# Patient Record
Sex: Male | Born: 1995 | Race: White | Hispanic: No | Marital: Single | Smoking: Never smoker
Health system: Southern US, Community
[De-identification: ages and names within clinical notes are randomized; demographics above are authoritative.]

## PROBLEM LIST (undated history)

## (undated) DIAGNOSIS — D367 Benign neoplasm of other specified sites: Secondary | ICD-10-CM

## (undated) HISTORY — PX: NO PAST SURGERIES: SHX2092

---
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
## 2017-04-26 ENCOUNTER — Encounter (HOSPITAL_COMMUNITY): Payer: Self-pay | Admitting: Emergency Medicine

## 2017-04-26 ENCOUNTER — Emergency Department (HOSPITAL_COMMUNITY)
Admission: EM | Admit: 2017-04-26 | Discharge: 2017-04-26 | Disposition: A | Discharge status: Home / Self Care | Payer: Medicaid Other | Attending: Emergency Medicine | Admitting: Emergency Medicine

## 2017-04-26 DIAGNOSIS — L02419 Cutaneous abscess of limb, unspecified: Secondary | ICD-10-CM

## 2017-04-26 DIAGNOSIS — L02414 Cutaneous abscess of left upper limb: Secondary | ICD-10-CM | POA: Insufficient documentation

## 2017-04-26 DIAGNOSIS — R2232 Localized swelling, mass and lump, left upper limb: Secondary | ICD-10-CM | POA: Diagnosis present

## 2017-04-26 MED ORDER — LIDOCAINE HCL (PF) 1 % IJ SOLN
5.0000 mL | Freq: Once | INTRAMUSCULAR | Status: AC
  Administered 2017-04-26: 5 mL
  Filled 2017-04-26: qty 5

## 2017-04-26 MED ORDER — CEPHALEXIN 500 MG PO CAPS: 500.0000 mg | ORAL_CAPSULE | Freq: Two times a day (BID) | ORAL | 0 refills | Status: AC

## 2017-04-26 NOTE — ED Triage Notes (Signed)
Pt has abscess to left forearm, states he is unsure if it was a pimple that got infected or a spider bite. Denies fevers

## 2017-04-26 NOTE — ED Provider Notes (Signed)
[facility] DEPT Provider Note   CSN: [id number] Arrival date & time: 04/26/17  1018  By signing my name below, I, Dora Sims, attest that this documentation has been prepared under the direction and in the presence of Shary Decamp, PA-C. Electronically Signed: Dora Sims, Scribe. 04/26/2017. 10:57 AM.  History   Chief Complaint Chief Complaint  Patient presents with  . Abscess   The history is provided by the patient. No language interpreter was used.    HPI Comments: Gerald Hamilton is a 21 y.o. male who presents to the Emergency Department for evaluation of a moderate, gradually worsening area of pain, swelling, and redness to the dorsal left forearm for one week. No known insect bites to the area. He has been cleaning the wound with soap and water and using peroxide without improvement. No medications tried. He states the pain intermittently began radiating distally last night and he developed some nausea this morning. Tetanus status is UTD. He denies fevers, chills, vomiting, or any other associated symptoms.  History reviewed. No pertinent past medical history.  There are no active problems to display for this patient.   History reviewed. No pertinent surgical history.   Home Medications    Prior to Admission medications   Medication Sig Start Date End Date Taking? Authorizing Provider  ibuprofen (ADVIL,MOTRIN) 600 MG tablet Take 1 tablet (600 mg total) by mouth every 6 (six) hours as needed for mild pain. 02/03/15   Isaac Bliss, MD  permethrin (ELIMITE) 5 % cream Apply from neck down.  Leave in place 8-12 hours, then wash off.  Repeat in 1 week, if needed. 05/04/15   Melony Overly, MD    Family History No family history on file.  Social History Social History  Substance Use Topics  . Smoking status: Never Smoker  . Smokeless tobacco: Not on file  . Alcohol use No     Allergies   Patient has no known allergies.   Review of Systems Review of Systems    Constitutional: Negative for chills and fever.  Gastrointestinal: Positive for nausea. Negative for vomiting.  Skin: Positive for color change and wound.   Physical Exam Updated Vital Signs BP 125/88   Pulse 92   Temp 97.9 F (36.6 C) (Oral)   Resp 15   SpO2 100%   Physical Exam  Constitutional: He is oriented to person, place, and time. He appears well-developed and well-nourished. No distress.  HENT:  Head: Normocephalic and atraumatic.  Eyes: Conjunctivae and EOM are normal.  Neck: Neck supple. No tracheal deviation present.  Cardiovascular: Normal rate.   Pulmonary/Chest: Effort normal. No respiratory distress.  Musculoskeletal: Normal range of motion.  Neurological: He is alert and oriented to person, place, and time.  Skin: Skin is warm and dry.  2 cm fluctuant abscess noted on left dorsal forearm. Mild erythema. TTP.  Psychiatric: He has a normal mood and affect. His behavior is normal.  Nursing note and vitals reviewed.  ED Treatments / Results  Labs (all labs ordered are listed, but only abnormal results are displayed) Labs Reviewed - No data to display  EKG  EKG Interpretation None       Radiology No results found.  Procedures Procedures (including critical care time)  DIAGNOSTIC STUDIES: Oxygen Saturation is 100% on RA, normal by my interpretation.    COORDINATION OF CARE: 10:51 AM Discussed treatment plan with pt at bedside and pt agreed to plan.  Medications Ordered in ED Medications  lidocaine (PF) (XYLOCAINE) 1 %  injection 5 mL (not administered)     Initial Impression / Assessment and Plan / ED Course  I have reviewed the triage vital signs and the nursing notes.  Pertinent labs & imaging results that were available during my care of the patient were reviewed by me and considered in my medical decision making (see chart for details).    {I have reviewed the relevant previous healthcare records.  {I obtained HPI from historian.   ED  Course:  Assessment: Gerald Hamilton is a 21 y.o. male who presents to ED for abscess requiring incision and drainage. I&D performed by rotating family medicine provider (see note). Patient tolerated the procedure well.  There is mild erythema. Will Rx keflex. Wound care instructions discussed. Wound check in 2-3 days. Return to ER if concern for spread of infection, increasing pain, fevers or other concerns. All questions answered.  Disposition/Plan:  DC Home Additional Verbal discharge instructions given and discussed with patient.  Pt Instructed to f/u with PCP in the next week for evaluation and treatment of symptoms. Return precautions given Pt acknowledges and agrees with plan  Supervising Physician Drenda Freeze, MD  Final Clinical Impressions(s) / ED Diagnoses   Final diagnoses:  Abscess of forearm    New Prescriptions New Prescriptions   No medications on file   I personally performed the services described in this documentation, which was scribed in my presence. The recorded information has been reviewed and is accurate.    Shary Decamp, PA-C 04/26/17 1102    Drenda Freeze, MD 04/27/17 [phone]

## 2017-04-26 NOTE — ED Provider Notes (Signed)
INCISION AND DRAINAGE Performed by: Lockie Pares Consent: Verbal consent obtained. Risks and benefits: risks, benefits and alternatives were discussed Type: abscess  Body area: left forearm   Anesthesia: local infiltration  Incision was made with a scalpel.  Local anesthetic: lidocaine 1%    Anesthetic total: 6 ml  Complexity: simple  No significant loculations  Drainage: purulent  Drainage amount: 3 ml   Patient tolerance: Patient tolerated the procedure well with no immediate complications.     Tonette Bihari, MD 04/26/17 1134    Drenda Freeze, MD 04/26/17 [phone]

## 2017-04-26 NOTE — Discharge Instructions (Signed)
Please read and follow all provided instructions.  Your diagnoses today include:  1. Abscess of forearm    Tests performed today include: Vital signs. See below for your results today.   Medications prescribed:   Take any prescribed medications only as directed.   Home care instructions:  Follow any educational materials contained in this packet  Follow-up instructions: Return to the Emergency Department in 48 hours for a recheck if your symptoms are not significantly improved.  Please follow-up with your primary care provider in the next 1 week for further evaluation of your symptoms.   Return instructions:  Return to the Emergency Department if you have: Fever Worsening symptoms Worsening pain Worsening swelling Redness of the skin that moves away from the affected area, especially if it streaks away from the affected area  Any other emergent concerns  Additional Information: If you have recurrent abscesses, try both the following. Use a Qtip to apply an over-the-counter antibiotic to the inside of your nostrils, twice a day for 5 days. Wash your body with over-the-counter Hibaclens once a day for one week and then once every two weeks. This can reduce the amount of bacterial on your skin that causes boils and lead to fewer boils. If you continue to have multiple or recurrent boils, you should see a dermatologist (skin doctor).   Your vital signs today were: BP 125/88    Pulse 92    Temp 97.9 F (36.6 C) (Oral)    Resp 15    SpO2 100%  If your blood pressure (BP) was elevated above 135/85 this visit, please have this repeated by your doctor within one month. --------------

## 2017-04-26 NOTE — ED Notes (Signed)
Pt has abscess on left forearm x 1 week. States is mildly painful and has been draining at times. States others in is household have had similar lesions.

## 2018-01-03 ENCOUNTER — Ambulatory Visit (HOSPITAL_COMMUNITY)
Admission: EM | Admit: 2018-01-03 | Discharge: 2018-01-03 | Disposition: A | Discharge status: Home / Self Care | Payer: Medicaid Other | Attending: Family Medicine | Admitting: Family Medicine

## 2018-01-03 ENCOUNTER — Other Ambulatory Visit: Payer: Self-pay

## 2018-01-03 DIAGNOSIS — R002 Palpitations: Secondary | ICD-10-CM

## 2018-01-03 NOTE — ED Triage Notes (Signed)
States while at work today had heart palpitations and vision became blurry. States had not eaten earlier and once ate vision improved. Denies palpitations at this time.

## 2018-01-03 NOTE — ED Provider Notes (Signed)
  [place]    CSN: [id number] Arrival date & time: 01/03/18  1837   Chief Complaint  Patient presents with  . Palpitations    Gerald Hamilton is a 22 y.o. male here for palpitations.  Length of issue: 1 day How long does it last: a few seconds Light headedness/passing out? Yes, felt like he was going to pass out Chest pain/shortness of breath? No Hx of arrhythmia? No Medication changes/illicit substances? No  ROS: Cardiac: As noted in HPI Pulm: No SOB  No known medical hx.   No prior surgeries.  Takes no meds routinely.  NKDA    BP 125/78 (BP Location: Right Arm)   Pulse 85   Temp 98.2 F (36.8 C) (Oral)   SpO2 100%  Gen: Awake, alert, appearing stated age Eyes: PERRLA Mouth: MMM Heart: RRR, no bruits, no LE edema Lungs: CTAB, no accessory muscle use Neuro: No cerebellar signs MSK: No muscle group atrophy or asymmetry Psych: Age appropriate judgment and insight, mood/affect WNL  Palpitations  EKG shows sinus arrhythmia. Reassurance for now.  Follow up with reg pcp if continuing. Not likely to be related structurally. The patient voiced understanding and agreement to the plan.      Shelda Pal, [place] 01/03/18 1931

## 2018-12-16 ENCOUNTER — Encounter (HOSPITAL_COMMUNITY): Payer: Self-pay

## 2018-12-16 ENCOUNTER — Ambulatory Visit (HOSPITAL_COMMUNITY)
Admission: EM | Admit: 2018-12-16 | Discharge: 2018-12-16 | Disposition: A | Discharge status: Home / Self Care | Payer: Medicaid Other | Attending: Emergency Medicine | Admitting: Emergency Medicine

## 2018-12-16 DIAGNOSIS — J40 Bronchitis, not specified as acute or chronic: Secondary | ICD-10-CM | POA: Diagnosis not present

## 2018-12-16 MED ORDER — FLUTICASONE PROPIONATE 50 MCG/ACT NA SUSP: 2.0000 | Freq: Every day | NASAL | 0 refills | Status: DC

## 2018-12-16 MED ORDER — PREDNISONE 20 MG PO TABS: 40.0000 mg | ORAL_TABLET | Freq: Every day | ORAL | 0 refills | Status: AC

## 2018-12-16 MED ORDER — ALBUTEROL SULFATE HFA 108 (90 BASE) MCG/ACT IN AERS: 1.0000 | INHALATION_SPRAY | Freq: Four times a day (QID) | RESPIRATORY_TRACT | 0 refills | Status: DC | PRN

## 2018-12-16 MED ORDER — AEROCHAMBER PLUS MISC: 2 refills | Status: DC

## 2018-12-16 NOTE — ED Provider Notes (Signed)
HPI  SUBJECTIVE:  Gerald Hamilton is a 23 y.o. male who presents with nonproductive cough, states that he has felt feverish, but has never checked his temperature, chest congestion, nasal congestion, rhinorrhea, postnasal drip, headaches for the past 5 days.  Reports wheezing after coughing only.  He states that the chest congestion is starting to get better.  Denies sinus pain or pressure, chest pain, shortness of breath, dyspnea on exertion.  He is able to sleep at night without waking up coughing.  No allergy or GERD symptoms.  No antibiotics in the past 3 months.  No antipyretic in the past 4 to 6 hours.  He tried NyQuil with improvement in his symptoms.  No aggravating factors.  Past medical history negative for asthma, smoking tobacco, vaping.  He smokes marijuana occasionally, last use 1 week ago.  No history of allergies, diabetes, hypertension.  PMD: None.  History reviewed. No pertinent past medical history.  History reviewed. No pertinent surgical history.  No family history on file.  Social History   Tobacco Use  . Smoking status: Never Smoker  . Smokeless tobacco: Never Used  Substance Use Topics  . Alcohol use: No  . Drug use: No    No current facility-administered medications for this encounter.   Current Outpatient Medications:  .  albuterol (PROVENTIL HFA;VENTOLIN HFA) 108 (90 Base) MCG/ACT inhaler, Inhale 1-2 puffs into the lungs every 6 (six) hours as needed for wheezing or shortness of breath., Disp: 1 Inhaler, Rfl: 0 .  fluticasone (FLONASE) 50 MCG/ACT nasal spray, Place 2 sprays into both nostrils daily., Disp: 16 g, Rfl: 0 .  predniSONE (DELTASONE) 20 MG tablet, Take 2 tablets (40 mg total) by mouth daily with breakfast for 5 days., Disp: 10 tablet, Rfl: 0 .  Spacer/Aero-Holding Chambers (AEROCHAMBER PLUS) inhaler, Use as instructed, Disp: 1 each, Rfl: 2  No Known Allergies   ROS  As noted in HPI.   Physical Exam  BP 126/75 (BP Location: Right Arm)   Pulse 91    Temp 98.1 F (36.7 C) (Oral)   Resp 18   SpO2 100%   Constitutional: Well developed, well nourished, no acute distress Eyes:  EOMI, conjunctiva normal bilaterally HENT: Normocephalic, atraumatic,mucus membranes moist.  Positive clear nasal congestion.  No sinus tenderness.  Positive erythematous, swollen turbinates.  Positive cobblestoning.  No postnasal drip. Respiratory: Normal inspiratory effort, rhonchi that clear with coughing.  Wheezing at bases bilaterally.  Fair air movement.  No chest wall tenderness Cardiovascular: Normal rate, regular rhythm, no murmurs rubs or gallops GI: nondistended skin: No rash, skin intact Musculoskeletal: no deformities Neurologic: Alert & oriented x 3, no focal neuro deficits Psychiatric: Speech and behavior appropriate   ED Course   Medications - No data to display  No orders of the defined types were placed in this encounter.   No results found for this or any previous visit (from the past 24 hour(s)). No results found.  ED Clinical Impression  Bronchitis   ED Assessment/Plan  presentation consistent with bronchitis.  Home with scheduled albuterol with a spacer, Flonase, Mucinex D, Tessalon, 40 mg of prednisone for 5 days.  Return here in 5 or 6 days if not better, to the ER if he gets worse.  Providing primary care list for ongoing care..  Discussed MDM, treatment plan, and plan for follow-up with patient. Discussed sn/sx that should prompt return to the ED. patient  agrees with plan.   Meds ordered this encounter  Medications  . albuterol (  PROVENTIL HFA;VENTOLIN HFA) 108 (90 Base) MCG/ACT inhaler    Sig: Inhale 1-2 puffs into the lungs every 6 (six) hours as needed for wheezing or shortness of breath.    Dispense:  1 Inhaler    Refill:  0  . fluticasone (FLONASE) 50 MCG/ACT nasal spray    Sig: Place 2 sprays into both nostrils daily.    Dispense:  16 g    Refill:  0  . predniSONE (DELTASONE) 20 MG tablet    Sig: Take 2  tablets (40 mg total) by mouth daily with breakfast for 5 days.    Dispense:  10 tablet    Refill:  0  . Spacer/Aero-Holding Chambers (AEROCHAMBER PLUS) inhaler    Sig: Use as instructed    Dispense:  1 each    Refill:  2    *This clinic note was created using Dragon dictation software. Therefore, there may be occasional mistakes despite careful proofreading.   ?   Melynda Ripple, MD 12/16/18 2000

## 2018-12-16 NOTE — ED Triage Notes (Signed)
Pt c/o cough x1wk, had a sore throat but went away, yesterday had no energy but feeling better today. States needs a MD note for work

## 2018-12-16 NOTE — Discharge Instructions (Addendum)
2 puffs from your albuterol inhaler every 4 hours for the next 2 days, then every 6 hours for 2 days after that.  Then you may use your albuterol as needed.  Use a spacer when you use your albuterol inhaler.  Try some Mucinex D, Flonase, finish the prednisone even if you feel better.  Tessalon will help with the cough.  Follow-up with a primary care physician for ongoing care, see list below.  Below is a list of primary care practices who are taking new patients for you to follow-up with.  [facility] at [facility] [address] [place], [place] [address] [phone]  [place], [place] [address] [phone]  [facility] [phone] [place] [address]  [facility]: [place]  [phone]  [facility]: [place]   [phone]  [facility]: [address] [place]  [phone]  [facility] : [address] [phone]  [facility]: [address] [place] [address] [phone]  [facility]: [place] [phone]  Dr. Blanchie Serve [place]  [phone]  Dr. Benito Mccreedy, [facility] [place], [place] [address]  [phone]  Go to www.goodrx.com to look up your medications. This will give you a list of where you can find your prescriptions at the most affordable prices. Or ask the pharmacist what the cash price is, or if they have any other discount programs available to help make your medication more  affordable. This can be less expensive than what you would pay with insurance.

## 2018-12-21 ENCOUNTER — Other Ambulatory Visit: Payer: Self-pay

## 2018-12-21 ENCOUNTER — Encounter (HOSPITAL_COMMUNITY): Payer: Self-pay | Admitting: Emergency Medicine

## 2018-12-21 ENCOUNTER — Ambulatory Visit (HOSPITAL_COMMUNITY)
Admission: EM | Admit: 2018-12-21 | Discharge: 2018-12-21 | Disposition: A | Discharge status: Home / Self Care | Payer: Medicaid Other | Attending: Family Medicine | Admitting: Family Medicine

## 2018-12-21 DIAGNOSIS — R059 Cough, unspecified: Secondary | ICD-10-CM

## 2018-12-21 DIAGNOSIS — R05 Cough: Secondary | ICD-10-CM | POA: Diagnosis not present

## 2018-12-21 NOTE — ED Provider Notes (Signed)
[place]    CSN: [id number] Arrival date & time: 12/21/18  1048     History   Chief Complaint Chief Complaint  Patient presents with  . Cough    HPI Gerald Hamilton is a 23 y.o. male.   Pt is a 23 year old male here for reevaluation and return to work.  He was seen here 5 days ago and treated for bronchitis.  Reports that he has been taking all the medications that were prescribed.  He is feeling better.  He still has mild lingering cough.  He is hoping to go back to work.     History reviewed. No pertinent past medical history.  There are no active problems to display for this patient.   History reviewed. No pertinent surgical history.     Home Medications    Prior to Admission medications   Medication Sig Start Date End Date Taking? Authorizing Provider  albuterol (PROVENTIL HFA;VENTOLIN HFA) 108 (90 Base) MCG/ACT inhaler Inhale 1-2 puffs into the lungs every 6 (six) hours as needed for wheezing or shortness of breath. 12/16/18  Yes Melynda Ripple, MD  fluticasone (FLONASE) 50 MCG/ACT nasal spray Place 2 sprays into both nostrils daily. 12/16/18  Yes Melynda Ripple, MD  predniSONE (DELTASONE) 20 MG tablet Take 2 tablets (40 mg total) by mouth daily with breakfast for 5 days. 12/16/18 12/21/18 Yes Melynda Ripple, MD  Spacer/Aero-Holding Chambers (AEROCHAMBER PLUS) inhaler Use as instructed 12/16/18   Melynda Ripple, MD    Family History Family History  Problem Relation Age of Onset  . Healthy Mother   . Healthy Father     Social History Social History   Tobacco Use  . Smoking status: Never Smoker  . Smokeless tobacco: Never Used  Substance Use Topics  . Alcohol use: No  . Drug use: No     Allergies   Patient has no known allergies.   Review of Systems Review of Systems  Constitutional: Negative for fatigue and fever.  Respiratory: Positive for cough. Negative for apnea, choking, chest tightness, shortness of breath, wheezing and  stridor.      Physical Exam Triage Vital Signs ED Triage Vitals [12/21/18 1116]  Enc Vitals Group     BP 117/70     Pulse Rate 66     Resp 18     Temp 98.2 F (36.8 C)     Temp Source Oral     SpO2 100 %     Weight      Height      Head Circumference      Peak Flow      Pain Score 0     Pain Loc      Pain Edu?      Excl. in [place]?    No data found.  Updated Vital Signs BP 117/70 (BP Location: Left Arm)   Pulse 66   Temp 98.2 F (36.8 C) (Oral)   Resp 18   SpO2 100%   Visual Acuity Right Eye Distance:   Left Eye Distance:   Bilateral Distance:    Right Eye Near:   Left Eye Near:    Bilateral Near:     Physical Exam Vitals signs and nursing note reviewed.  Constitutional:      General: He is not in acute distress.    Appearance: He is well-developed. He is not ill-appearing, toxic-appearing or diaphoretic.  HENT:     Head: Normocephalic and atraumatic.     Right Ear: Tympanic  membrane and ear canal normal.     Left Ear: Tympanic membrane and ear canal normal.     Nose: Nose normal.     Mouth/Throat:     Pharynx: Oropharynx is clear.  Eyes:     Conjunctiva/sclera: Conjunctivae normal.  Neck:     Musculoskeletal: Normal range of motion.  Cardiovascular:     Rate and Rhythm: Normal rate and regular rhythm.     Heart sounds: No murmur.  Pulmonary:     Effort: Pulmonary effort is normal. No respiratory distress.     Breath sounds: Normal breath sounds.  Musculoskeletal: Normal range of motion.  Skin:    General: Skin is warm and dry.  Neurological:     Mental Status: He is alert.  Psychiatric:        Mood and Affect: Mood normal.      UC Treatments / Results  Labs (all labs ordered are listed, but only abnormal results are displayed) Labs Reviewed - No data to display  EKG None  Radiology No results found.  Procedures Procedures (including critical care time)  Medications Ordered in UC Medications - No data to display  Initial  Impression / Assessment and Plan / UC Course  I have reviewed the triage vital signs and the nursing notes.  Pertinent labs & imaging results that were available during my care of the patient were reviewed by me and considered in my medical decision making (see chart for details).    Cough and return to work   Patient appears well. Lungs clear Vital signs stable, nontoxic or ill-appearing. I feel it is safe for him to return to work. He can continue the medications that were prescribed Follow up as needed for continued or worsening symptoms  Final Clinical Impressions(s) / UC Diagnoses   Final diagnoses:  Cough     Discharge Instructions     Seems as if your  symptoms have gotten better. I believe it is safe you to go back to work Follow up as needed for continued or worsening symptoms     ED Prescriptions    None     Controlled Substance Prescriptions [place] Controlled Substance Registry consulted? Not Applicable   Orvan July, NP 12/21/18 1214

## 2018-12-21 NOTE — ED Triage Notes (Signed)
Pt presents to [facility] for assessment of 2 days of cough with body aches on day 1.  States now just a very mild cough, but boss would not let him return to work without being seen.

## 2018-12-21 NOTE — Discharge Instructions (Signed)
Seems as if your  symptoms have gotten better. I believe it is safe you to go back to work Follow up as needed for continued or worsening symptoms

## 2019-05-11 ENCOUNTER — Other Ambulatory Visit: Payer: Self-pay

## 2019-05-11 ENCOUNTER — Ambulatory Visit (HOSPITAL_COMMUNITY)
Admission: EM | Admit: 2019-05-11 | Discharge: 2019-05-11 | Disposition: A | Discharge status: Home / Self Care | Payer: Medicaid Other | Attending: Family Medicine | Admitting: Family Medicine

## 2019-05-11 ENCOUNTER — Encounter (HOSPITAL_COMMUNITY): Payer: Self-pay | Admitting: *Deleted

## 2019-05-11 DIAGNOSIS — B86 Scabies: Secondary | ICD-10-CM

## 2019-05-11 DIAGNOSIS — T7840XA Allergy, unspecified, initial encounter: Secondary | ICD-10-CM

## 2019-05-11 MED ORDER — METHYLPREDNISOLONE SODIUM SUCC 125 MG IJ SOLR
INTRAMUSCULAR | Status: AC
  Filled 2019-05-11: qty 2

## 2019-05-11 MED ORDER — METHYLPREDNISOLONE SODIUM SUCC 125 MG IJ SOLR
80.0000 mg | Freq: Once | INTRAMUSCULAR | Status: AC
  Administered 2019-05-11: 80 mg via INTRAMUSCULAR

## 2019-05-11 MED ORDER — PERMETHRIN 5 % EX CREA: TOPICAL_CREAM | CUTANEOUS | 1 refills | Status: DC

## 2019-05-11 NOTE — Discharge Instructions (Addendum)
Use the medication as prescribed.  Apply from head to toe and leave on for at least 8 hours and then wash off next morning. You may repeat this in 1 week if needed Steroid injection given here for the allergic reaction You can do Benadryl as needed for itching Follow up as needed for continued or worsening symptoms

## 2019-05-11 NOTE — ED Provider Notes (Signed)
[place]    CSN: [id number] Arrival date & time: 05/11/19  1325     History   Chief Complaint Chief Complaint  Patient presents with  . Rash    HPI Gerald Hamilton is a 23 y.o. male.   Patient is a 23 year old male who presents today with rash.  The rash is been present worsening over the past week.  Rash started on the stomach and back area and now is radiating to inner thighs.  Describes rash is very pruritic.  Notes that similar to rash previously when diagnosed with scabies.   Denies any fever, joint pain. Denies any recent changes in lotions, detergents, foods or other possible irritants. No recent travel. Nobody else at home has the rash. Patient has been outside but denies any contact with plants or insects. No new foods or medications.   ROS per HPI        History reviewed. No pertinent past medical history.  There are no active problems to display for this patient.   History reviewed. No pertinent surgical history.     Home Medications    Prior to Admission medications   Medication Sig Start Date End Date Taking? Authorizing Provider  albuterol (PROVENTIL HFA;VENTOLIN HFA) 108 (90 Base) MCG/ACT inhaler Inhale 1-2 puffs into the lungs every 6 (six) hours as needed for wheezing or shortness of breath. 12/16/18   Melynda Ripple, MD  fluticasone (FLONASE) 50 MCG/ACT nasal spray Place 2 sprays into both nostrils daily. 12/16/18   Melynda Ripple, MD  permethrin Nancy Fetter) 5 % cream Apply to affected area once 05/11/19   Orvan July, NP  Spacer/Aero-Holding Chambers (AEROCHAMBER PLUS) inhaler Use as instructed 12/16/18   Melynda Ripple, MD    Family History Family History  Problem Relation Age of Onset  . Healthy Mother   . Healthy Father     Social History Social History   Tobacco Use  . Smoking status: Never Smoker  . Smokeless tobacco: Never Used  Substance Use Topics  . Alcohol use: No  . Drug use: No     Allergies   Patient  has no known allergies.   Review of Systems Review of Systems   Physical Exam Triage Vital Signs ED Triage Vitals  Enc Vitals Group     BP 05/11/19 1348 139/87     Pulse Rate 05/11/19 1348 78     Resp 05/11/19 1348 16     Temp 05/11/19 1348 98.1 F (36.7 C)     Temp Source 05/11/19 1348 Oral     SpO2 05/11/19 1348 98 %     Weight --      Height --      Head Circumference --      Peak Flow --      Pain Score 05/11/19 1346 0     Pain Loc --      Pain Edu? --      Excl. in [place]? --    No data found.  Updated Vital Signs BP 139/87 (BP Location: Left Arm)   Pulse 78   Temp 98.1 F (36.7 C) (Oral)   Resp 16   SpO2 98%   Visual Acuity Right Eye Distance:   Left Eye Distance:   Bilateral Distance:    Right Eye Near:   Left Eye Near:    Bilateral Near:     Physical Exam Vitals signs and nursing note reviewed.  Constitutional:      Appearance: Normal appearance.  HENT:  Head: Normocephalic and atraumatic.     Nose: Nose normal.     Mouth/Throat:     Pharynx: Oropharynx is clear.  Eyes:     Conjunctiva/sclera: Conjunctivae normal.  Neck:     Musculoskeletal: Normal range of motion.  Pulmonary:     Effort: Pulmonary effort is normal.  Musculoskeletal: Normal range of motion.  Skin:    General: Skin is warm and dry.     Findings: Erythema and rash present.     Comments: Widespread erythematous papules noted to entire back, stomach and upper leg areas   Neurological:     Mental Status: He is alert.  Psychiatric:        Mood and Affect: Mood normal.      UC Treatments / Results  Labs (all labs ordered are listed, but only abnormal results are displayed) Labs Reviewed - No data to display  EKG   Radiology No results found.  Procedures Procedures (including critical care time)  Medications Ordered in UC Medications  methylPREDNISolone sodium succinate (SOLU-MEDROL) 125 mg/2 mL injection 80 mg (has no administration in time range)     Initial Impression / Assessment and Plan / UC Course  I have reviewed the triage vital signs and the nursing notes.  Pertinent labs & imaging results that were available during my care of the patient were reviewed by me and considered in my medical decision making (see chart for details).     Rash consistent with scabies.  History of same.  Will treat with permethrin cream.  Repeat in 1 week as needed. Recommend he wash all sheets and clothing in hot water. Steroid injection given here in clinic for acute allergic reaction Recommended Benadryl as needed for itching Follow up as needed for continued or worsening symptoms  Final Clinical Impressions(s) / UC Diagnoses   Final diagnoses:  Scabies  Allergic reaction, initial encounter     Discharge Instructions     Use the medication as prescribed.  Apply from head to toe and leave on for at least 8 hours and then wash off next morning. You may repeat this in 1 week if needed Steroid injection given here for the allergic reaction You can do Benadryl as needed for itching Follow up as needed for continued or worsening symptoms     ED Prescriptions    Medication Sig Dispense Auth. Provider   permethrin (ELIMITE) 5 % cream Apply to affected area once 60 g Loura Halt A, NP     Controlled Substance Prescriptions [place] Controlled Substance Registry consulted? Not Applicable   Orvan July, NP 05/11/19 1430

## 2019-05-11 NOTE — ED Triage Notes (Signed)
Patient reports itching started about 1 week and rash started about 5 days, patient reports back and stomach and radiating to thighs.   Patient also needs work note.

## 2019-11-30 ENCOUNTER — Ambulatory Visit (HOSPITAL_COMMUNITY)
Admission: EM | Admit: 2019-11-30 | Discharge: 2019-11-30 | Disposition: A | Discharge status: Home / Self Care | Payer: Medicaid Other | Attending: Urgent Care | Admitting: Urgent Care

## 2019-11-30 ENCOUNTER — Encounter (HOSPITAL_COMMUNITY): Payer: Self-pay

## 2019-11-30 ENCOUNTER — Other Ambulatory Visit: Payer: Self-pay

## 2019-11-30 DIAGNOSIS — B078 Other viral warts: Secondary | ICD-10-CM | POA: Diagnosis not present

## 2019-11-30 MED ORDER — IMIQUIMOD 5 % EX CREA: TOPICAL_CREAM | Freq: Every day | CUTANEOUS | 2 refills | Status: DC

## 2019-11-30 NOTE — ED Triage Notes (Signed)
Pt states he has has a wart on his right hand for a while and he decided to get rid of it. Now it hurts and he wants it removed.

## 2019-11-30 NOTE — ED Provider Notes (Signed)
[place]   MRN: [id number] DOB: 01-Sep-1996  Subjective:   Gerald Hamilton is a 24 y.o. male presenting for consult on removal of a wart of his right hand. Has been using topical otc treatment with some success but ran out. Wants to know if it can be removed at our clinic.   No current facility-administered medications for this encounter.  Current Outpatient Medications:  .  albuterol (PROVENTIL HFA;VENTOLIN HFA) 108 (90 Base) MCG/ACT inhaler, Inhale 1-2 puffs into the lungs every 6 (six) hours as needed for wheezing or shortness of breath. (Patient not taking: Reported on 11/30/2019), Disp: 1 Inhaler, Rfl: 0 .  fluticasone (FLONASE) 50 MCG/ACT nasal spray, Place 2 sprays into both nostrils daily. (Patient not taking: Reported on 11/30/2019), Disp: 16 g, Rfl: 0 .  permethrin (ELIMITE) 5 % cream, Apply to affected area once (Patient not taking: Reported on 11/30/2019), Disp: 60 g, Rfl: 1 .  Spacer/Aero-Holding Chambers (AEROCHAMBER PLUS) inhaler, Use as instructed (Patient not taking: Reported on 11/30/2019), Disp: 1 each, Rfl: 2   No Known Allergies  History reviewed. No pertinent past medical history.   History reviewed. No pertinent surgical history.  Family History  Problem Relation Age of Onset  . Healthy Mother   . Healthy Father     Social History   Tobacco Use  . Smoking status: Never Smoker  . Smokeless tobacco: Never Used  Substance Use Topics  . Alcohol use: No  . Drug use: No    ROS   Objective:   Vitals: BP 122/74 (BP Location: Right Arm)   Pulse 74   Temp 98.4 F (36.9 C) (Oral)   Resp 16   Wt 125 lb (56.7 kg)   SpO2 100%   Physical Exam Constitutional:      General: He is not in acute distress.    Appearance: Normal appearance. He is well-developed and normal weight. He is not ill-appearing, toxic-appearing or diaphoretic.  HENT:     Head: Normocephalic and atraumatic.     Right Ear: External ear normal.     Left Ear: External ear normal.       Nose: Nose normal.     Mouth/Throat:     Pharynx: Oropharynx is clear.  Eyes:     General: No scleral icterus.       Right eye: No discharge.        Left eye: No discharge.     Extraocular Movements: Extraocular movements intact.     Pupils: Pupils are equal, round, and reactive to light.  Cardiovascular:     Rate and Rhythm: Normal rate.  Pulmonary:     Effort: Pulmonary effort is normal.  Musculoskeletal:       Arms:     Cervical back: Normal range of motion.  Neurological:     Mental Status: He is alert and oriented to person, place, and time.  Psychiatric:        Mood and Affect: Mood normal.        Behavior: Behavior normal.        Thought Content: Thought content normal.        Judgment: Judgment normal.      Assessment and Plan :   1. Common wart     This is high risk area for procedural removal given extensor ligaments. Imiquimod is covered by Medicaid and therefore will have patient use this. Referral to dermatology is pending. Counseled patient on potential for adverse effects with medications prescribed/recommended today, ER and  return-to-clinic precautions discussed, patient verbalized understanding.    Jaynee Eagles, [place] 11/30/19 1953

## 2020-10-14 ENCOUNTER — Other Ambulatory Visit (HOSPITAL_COMMUNITY)
Admission: RE | Admit: 2020-10-14 | Discharge: 2020-10-14 | Disposition: A | Discharge status: Home / Self Care | Payer: Medicaid Other | Source: Ambulatory Visit | Attending: Family Medicine | Admitting: Family Medicine

## 2020-10-14 ENCOUNTER — Other Ambulatory Visit: Payer: Self-pay

## 2020-10-14 ENCOUNTER — Ambulatory Visit (INDEPENDENT_AMBULATORY_CARE_PROVIDER_SITE_OTHER): Payer: Medicaid Other | Admitting: Family Medicine

## 2020-10-14 VITALS — BP 120/60 | HR 95 | Ht 63.0 in | Wt 123.5 lb

## 2020-10-14 DIAGNOSIS — Z23 Encounter for immunization: Secondary | ICD-10-CM

## 2020-10-14 DIAGNOSIS — Z7689 Persons encountering health services in other specified circumstances: Secondary | ICD-10-CM | POA: Insufficient documentation

## 2020-10-14 DIAGNOSIS — R221 Localized swelling, mass and lump, neck: Secondary | ICD-10-CM | POA: Diagnosis present

## 2020-10-14 NOTE — Patient Instructions (Signed)
It was nice to meet you today,  I will call you with the lab tests when I get them.  This usually takes 2 days.  For the lump on your neck I need you to the ear nose and throat doctor for evaluation of possible thyroglossal duct or bronchial cleft cyst.  Give them a week to call you back.  If you have not heard from them in a week please call our office and let us know.  I would talk to your parents about finding out if you are on their insurance.  Typically children can remain on her parents insurance until age of 51.  If not you can also look into the governments insurance marketplace at healthcare.gov for reduced cost insurance.  Have a great day,  Clemetine Marker, MD

## 2020-10-14 NOTE — Assessment & Plan Note (Signed)
Nontender, firm mobile mass on the neck behind the right ear that has been present for at least three years.  Differential includes sebaceous cyst, enlarged lymph node, or branchial cleft or thyroglossal duct cyst.  Will refer to ENT to evaluate for branchial cleft cyst.

## 2020-10-14 NOTE — Progress Notes (Signed)
   Covid-19 Vaccination Clinic  Name:  Gerald Hamilton    MRN: [id number] DOB: [birth year]  10/14/2020   First COVID vaccine given in RD, site unremarkable, tolerated injection well.   Gerald Hamilton was observed post Covid-19 immunization for 15 minutes without incident. He was provided with Vaccine Information Sheet and instruction to access the V-Safe system.   Gerald Hamilton was instructed to call 911 with any severe reactions post vaccine: Marland Kitchen Difficulty breathing  . Swelling of face and throat  . A fast heartbeat  . A bad rash all over body  . Dizziness and weakness    Provided patient with updated immunization record and card. Scheduled patient for nurse visit on 1/31 for second COVID vaccine.   Talbot Grumbling, RN

## 2020-10-14 NOTE — Progress Notes (Signed)
    SUBJECTIVE:   CHIEF COMPLAINT / HPI:  Patient is here to establish care.  Has no concerns other than the on the right side of his neck  Right-sided neck mass.:  Patient states that 3 years ago he noticed a mass on the right side of his neck behind his ear.  It is nontender.  He states that over the past 3 months it has increased in size to the point where others have noticed it.Marland Kitchen  He states it is nontender.  Has never had any drainage.  It is flesh-colored.  Has never noticed any rash.  Works at the [employer] and goes to school at [employer] and math.  Currently getting his GED.   Lives with sister.  Not currently in a relationship.  Sexually active  With male partner.  Wants STD testing.  No tobacco use.  Uses alcohol on the weekends.  Drinks 2 glasses of wine on weekends.  No marijuana or recreational drug use.    PERTINENT  PMH / PSH: None  OBJECTIVE:   BP 120/60   Pulse 95   Ht 5\' 3"  (1.6 m)   Wt 123 lb 8 oz (56 kg)   SpO2 98%   BMI 21.88 kg/m   General: Alert and oriented.  No acute distress.  Young healthy appearing male. HEENT: Pea-sized mobile firm well-circumscribed mass on the neck approximately 2 inches posterior and inferior to the ear.  Nontender.  Flesh-colored.  No drainage or punctate lesion.  PERRLA.,  EOMI.  Moist oral mucosa.  No oral pharyngeal erythema. Neck: Other than the mass stated above no cervical lymphadenopathy.  No thyromegaly.  Rhythm Pulmonary: Lungs clear auscultation bilaterally Abdomen: Soft, nontender to palpation normal bowel sounds. MSK: 5/5 strength upper and lower extremities bilaterally. Psych: Quiet, but otherwise pleasant affect with spontaneous speech.  ASSESSMENT/PLAN:   Neck mass Nontender, firm mobile mass on the neck behind the right ear that has been present for at least three years.  Differential includes sebaceous cyst, enlarged lymph node, or branchial cleft or thyroglossal duct cyst.  Will refer  to ENT to evaluate for branchial cleft cyst.   Encounter for assessment of STD exposure Sexually active with male partners as recently as last week. Desires STI testing.   - HIV, hep C, RPR - urine GC/CT     Benay Pike, MD [place]

## 2020-10-14 NOTE — Assessment & Plan Note (Signed)
Sexually active with male partners as recently as last week. Desires STI testing.   - HIV, hep C, RPR - urine GC/CT

## 2020-10-15 LAB — HEPATITIS C ANTIBODY: Hep C Virus Ab: <0.1 s/co ratio (ref 0.0–0.9)

## 2020-10-15 LAB — RPR: RPR Ser Ql: NONREACTIVE

## 2020-10-15 LAB — HIV ANTIBODY (ROUTINE TESTING W REFLEX): HIV Screen 4th Generation wRfx: NONREACTIVE

## 2020-10-17 LAB — URINE CYTOLOGY ANCILLARY ONLY
Chlamydia: NEGATIVE
Comment: NEGATIVE
Comment: NORMAL
Neisseria Gonorrhea: NEGATIVE

## 2020-11-07 ENCOUNTER — Ambulatory Visit: Payer: Medicaid Other

## 2020-11-14 ENCOUNTER — Ambulatory Visit: Payer: Medicaid Other

## 2021-04-19 ENCOUNTER — Other Ambulatory Visit: Payer: Self-pay

## 2021-04-19 ENCOUNTER — Ambulatory Visit (INDEPENDENT_AMBULATORY_CARE_PROVIDER_SITE_OTHER): Payer: Medicaid Other | Admitting: Family Medicine

## 2021-04-19 VITALS — BP 94/62 | HR 78 | Ht 63.0 in | Wt 127.4 lb

## 2021-04-19 DIAGNOSIS — R221 Localized swelling, mass and lump, neck: Secondary | ICD-10-CM

## 2021-04-19 DIAGNOSIS — B081 Molluscum contagiosum: Secondary | ICD-10-CM

## 2021-04-19 NOTE — Patient Instructions (Addendum)
Thank you for coming to see me today. It was a pleasure. Today we talked about:    Please follow-up with PCP in 1-2 weeks for freezing therapy of the lesions on your face  If you have any questions or concerns, please do not hesitate to call the office at [phone].  Best,   Carollee Leitz, MD    Molluscum Contagiosum, Adult Molluscum contagiosum is a skin infection that can cause a rash. The rash maygo away on its own, or it may need to be treated with a procedure or medicine. What are the causes? This condition is caused by a virus. The virus is contagious. This means that it can spread from person to person. It can spread through: Skin-to-skin contact with an infected person. Contact with an object that has the virus on it, such as a towel or clothing. Sexual activity. What increases the risk? You may be more likely to develop this condition if: You live in an area where the weather is moist and warm. You have a weak disease-fighting system (immune system). What are the signs or symptoms? The main symptom of this condition is a painless rash that appears 2-7 weeks after exposure to the virus. The rash is made up of small, dome-shaped bumps on the skin. The bumps may: Affect the genitals, thighs, face, neck, or abdomen. Be pink or flesh-colored. Appear one by one or in groups. Range from the size of a pinhead to the size of a pencil eraser. Feel firm, smooth, and waxy. Have a pit in the middle. Itch. For most people, the rash does not itch. How is this diagnosed? This condition may be diagnosed based on: Your symptoms and medical history. A physical exam. Scraping the bumps to collect a skin sample for testing. How is this treated? The rash usually goes away within 2 months, but it can sometimes take 6-12 months for it to clear completely. For some people, the rash may go away on its own without treatment. In some cases, treatment may be needed to keep the virus from  infecting other people or to keep the rash from spreading to other parts of your body. Treatment may also be done if you have anxiety or stress becauseof the way the rash looks. If needed, treatment may include: Surgery to remove the bumps by freezing them (cryosurgery). A procedure to scrape off the bumps (curettage). A procedure to remove the bumps with a laser. Putting medicine on the bumps (topical treatment). Follow these instructions at home: Take or apply over-the-counter and prescription medicines only as told by your health care provider. Do not scratch or pick at the bumps. Scratching or picking can cause the rash to spread to other parts of your body. How is this prevented? As long as you have bumps on your skin, the infection can spread to other people. To prevent this from happening: Do not share clothing or towels with others until the bumps go away. Do not use a public swimming pool, sauna, or shower until the bumps go away. Avoid close contact with others until the bumps go away. This includes sexual contact. Wash your hands often with soap and water. If soap and water are not available, use hand sanitizer. Cover the bumps with clothing or a bandage when you will be near other people. Contact a health care provider if: The bumps are spreading. The bumps are becoming red and sore. The bumps have not gone away after 12 months. Summary Molluscum contagiosum is  a skin infection that can cause a rash made up of small, dome-shaped bumps. The infection is caused by a virus. The rash usually goes away within 2 months, but it can sometimes take 6-12 months for it to clear completely. The rash often goes away on its own. However, treatment is sometimes recommended to keep the virus from infecting other people or to keep it from spreading to other parts of your body. This information is not intended to replace advice given to you by your health care provider. Make sure you discuss any  questions you have with your healthcare provider. Document Revised: 05/30/2020 Document Reviewed: 05/30/2020 Elsevier Patient Education  [place].

## 2021-04-19 NOTE — Progress Notes (Signed)
    SUBJECTIVE:   CHIEF COMPLAINT / HPI:   Patient unsure why he is here. Reports that he was called to come into make an appointment for ENT for the lump on his neck.  Was seen by Dr Jeannine Kitten back in January and referred to ENT.  Referral was not accepted due to insurance so needs new referral.  Reports no changes in neck mass since that time.    Facial lesion Reports break out of multiple facial lesions for about 5 months.  Largest lesion on right lower chin.  Has lesion on side of left chin and neck.  All lesions at different stages of growth.  Denies any fevers, pain, bleeding or discharge.  Denies any lesions anywhere else on body.    PERTINENT  PMH / PSH:  None  OBJECTIVE:   BP 94/62   Pulse 78   Ht 5\' 3"  (1.6 m)   Wt 127 lb 6.4 oz (57.8 kg)   SpO2 97%   BMI 22.57 kg/m    General: Alert, no acute distress Face: two small lesions with central indentation noted on chin area, similar lesion noted on left side of neck and one anterior lower neck.       Neck: firm non tender movable easily palpable mass noted on right side of neck  ASSESSMENT/PLAN:   Molluscum contagiosum Small circular lesions with central indentation consistent with Molluscum infection Discussed option to monitor as can self resolve in two three months or cryotherapy.  Opted for cryotherapy. -Scheduled appointment for cryotherapy 07/27   Neck mass No changes since previous visit.  ENT referral sent to out of network  -Will resend referral to ENT -Follow up as needed     Carollee Leitz, MD [place]

## 2021-04-21 ENCOUNTER — Encounter: Payer: Self-pay | Admitting: Family Medicine

## 2021-04-21 DIAGNOSIS — B081 Molluscum contagiosum: Secondary | ICD-10-CM | POA: Insufficient documentation

## 2021-04-21 NOTE — Assessment & Plan Note (Signed)
No changes since previous visit.  ENT referral sent to out of network  -Will resend referral to ENT -Follow up as needed

## 2021-04-21 NOTE — Assessment & Plan Note (Signed)
Small circular lesions with central indentation consistent with Molluscum infection Discussed option to monitor as can self resolve in two three months or cryotherapy.  Opted for cryotherapy. -Scheduled appointment for cryotherapy 07/27

## 2021-05-01 NOTE — Progress Notes (Deleted)
    SUBJECTIVE:   CHIEF COMPLAINT / HPI:   Presents for follow up for **. Seen in clinic on ** and treated with **.  Since then patient reports improvement in symptoms **. Associated symptoms include **.    PERTINENT  PMH / PSH: ***  OBJECTIVE:   There were no vitals taken for this visit.   General: Alert, no acute distress Cardio: Normal S1 and S2, RRR, no r/m/g Pulm: CTAB, normal work of breathing Abdomen: Bowel sounds normal. Abdomen soft and non-tender.  Extremities: No peripheral edema.  Neuro: Cranial nerves grossly intact   ASSESSMENT/PLAN:   No problem-specific Assessment & Plan notes found for this encounter.     Carollee Leitz, MD [place]

## 2021-05-03 ENCOUNTER — Ambulatory Visit: Payer: Medicaid Other | Admitting: Family Medicine

## 2021-05-31 ENCOUNTER — Encounter: Payer: Self-pay | Admitting: Family Medicine

## 2021-05-31 ENCOUNTER — Ambulatory Visit: Payer: Medicaid Other | Admitting: Family Medicine

## 2021-05-31 ENCOUNTER — Other Ambulatory Visit: Payer: Self-pay

## 2021-05-31 DIAGNOSIS — B081 Molluscum contagiosum: Secondary | ICD-10-CM | POA: Diagnosis not present

## 2021-05-31 NOTE — Progress Notes (Signed)
    SUBJECTIVE:   CHIEF COMPLAINT / HPI: follow up for lesions on face  Seen in clinic 07/13 for molluscum lesion on face and neck and was to return following week for cryotherapy.  Was unable to make second appointment,  Since last visit noted 4 more lesions, one one neck, two on posterior right thigh and one on right wrist.    PERTINENT  PMH / PSH:  Molluscum Contagiosum  OBJECTIVE:   BP 104/60   Pulse (!) 103   Ht '5\' 3"'$  (1.6 m)   Wt 132 lb 2 oz (59.9 kg)   SpO2 98%   BMI 23.40 kg/m    General: Alert, no acute distress Skin: multiple umbilicated lesions noted on right cheek, chin, neck, right posterior wrist and posterior right thigh.  8 lesions in total    ASSESSMENT/PLAN:   Molluscum contagiosum Risks and benefits of cryotherapy discussed, including redness, blistering and possibility of recurrence and need for repeat treatment.  Patient verbalized understanding and agreed to procedure.  Liquid nitrogen applied to 8 lesions visible, Right cheek, neck, posterior wrist and posterior thigh as well as left neck and chin. -Post procedure instructions provided. -Strict return precautions provided -Discussed PrEP and patient considering -Information provided -Follow up 1-2 weeks if wanting to initiate PrEP therapy.  Likely will start Truvada.   Carollee Leitz, MD [place]

## 2021-05-31 NOTE — Patient Instructions (Addendum)
Thank you for coming to see me today. It was a pleasure.   What to expect after cryotherapy: The site will become red after the procedure and may develop a blister.  This is normal.  Do not pick the the blister or scab off.    You can clean the area with mild soap and pat to dry.  Apply Vaseline to th area 1-2 times a day.  You do not need to cover with a Band-Aid or dressing.  I encourage you to consider PrEP therapy.  Please visit the following site and if you have any questions we can discuss at your next visit [email or web address]   Please follow-up with PCP as needed  If you have any questions or concerns, please do not hesitate to call the office at [phone].  Best,   Carollee Leitz, MD

## 2021-06-01 ENCOUNTER — Encounter: Payer: Self-pay | Admitting: Family Medicine

## 2021-06-01 NOTE — Assessment & Plan Note (Signed)
Risks and benefits of cryotherapy discussed, including redness, blistering and possibility of recurrence and need for repeat treatment.  Patient verbalized understanding and agreed to procedure.  Liquid nitrogen applied to 8 lesions visible, Right cheek, neck, posterior wrist and posterior thigh as well as left neck and chin. -Post procedure instructions provided. -Strict return precautions provided -Discussed PrEP and patient considering -Information provided -Follow up 1-2 weeks if wanting to initiate PrEP therapy.  Likely will start Truvada.

## 2021-06-18 NOTE — Progress Notes (Deleted)
    SUBJECTIVE:   CHIEF COMPLAINT / HPI:  Presents for follow up for **. Seen in clinic on ** and treated with **.  Since then patient reports improvement in symptoms **. Associated symptoms include **.    PERTINENT  PMH / PSH: ***  OBJECTIVE:   There were no vitals taken for this visit.   General: Alert, no acute distress Cardio: Normal S1 and S2, RRR, no r/m/g Pulm: CTAB, normal work of breathing Abdomen: Bowel sounds normal. Abdomen soft and non-tender.  Extremities: No peripheral edema.  Neuro: Cranial nerves grossly intact   ASSESSMENT/PLAN:   No problem-specific Assessment & Plan notes found for this encounter.     Carollee Leitz, MD [place]

## 2021-06-20 ENCOUNTER — Ambulatory Visit: Payer: Medicaid Other | Admitting: Family Medicine

## 2021-08-14 ENCOUNTER — Encounter (HOSPITAL_COMMUNITY): Payer: Self-pay | Admitting: Emergency Medicine

## 2021-08-14 ENCOUNTER — Other Ambulatory Visit: Payer: Self-pay

## 2021-08-14 ENCOUNTER — Ambulatory Visit (HOSPITAL_COMMUNITY)
Admission: EM | Admit: 2021-08-14 | Discharge: 2021-08-14 | Disposition: A | Discharge status: Home / Self Care | Payer: Medicaid Other | Attending: Emergency Medicine | Admitting: Emergency Medicine

## 2021-08-14 DIAGNOSIS — R21 Rash and other nonspecific skin eruption: Secondary | ICD-10-CM

## 2021-08-14 DIAGNOSIS — R1031 Right lower quadrant pain: Secondary | ICD-10-CM

## 2021-08-14 MED ORDER — PREDNISONE 10 MG (21) PO TBPK: ORAL_TABLET | Freq: Every day | ORAL | 0 refills | Status: DC

## 2021-08-14 NOTE — ED Triage Notes (Signed)
Abdominal pain is on right side of abdomen and started yesterday.  No nausea, no vomiting, no diarrhea.  Patient reports bumps on right thigh, says they do not itch

## 2021-08-14 NOTE — Discharge Instructions (Signed)
Starting tomorrow take prednisone every morning with food as directed  Follow up with urgent care or primary doctor as needed for worsening or persistent rash  Monitor for abdominal pain, if returns and is persist may follow up with primary doctor  for worsening symptoms please go to nearest emergency department for further evaluation

## 2021-08-14 NOTE — ED Provider Notes (Signed)
[facility]    CSN: [id number] Arrival date & time: 08/14/21  1212      History   Chief Complaint Chief Complaint  Patient presents with   Rash   Abdominal Pain    HPI Gerald Hamilton is a 25 y.o. male.   Patient presents with rash on the right upper thigh for 1 day.  Symptoms began abruptly, have not occurred before.  Has not attempted treatment. denies pruritus, pain ,drainage, fever, chills.  Denies changes in soaps, lotions, detergents, fragrances, linens, diet, recent travel.  Not currently sexually active.  History of molluscum contagiosum.  Patient has concerns about right lower quadrant pain, described as intermittent and sharp beginning yesterday.  Pain has now resolved since being in the waiting room.  pain is not worsened by movement.  Able to twist and turn.  Denies nausea, vomiting, diarrhea, constipation, fever, chills,, heartburn and indigestion, increased bloating, increased gas production, urinary changes, bowel changes.  Has not attempted treatment of symptoms.  No pertinent medical history.  History reviewed. No pertinent past medical history.  Patient Active Problem List   Diagnosis Date Noted   Molluscum contagiosum 04/21/2021   Neck mass 10/14/2020   Encounter for assessment of STD exposure 10/14/2020    History reviewed. No pertinent surgical history.     Home Medications    Prior to Admission medications   Medication Sig Start Date End Date Taking? Authorizing Provider  predniSONE (STERAPRED UNI-PAK 21 TAB) 10 MG (21) TBPK tablet Take by mouth daily. Take 6 tabs by mouth daily  for 2 days, then 5 tabs for 2 days, then 4 tabs for 2 days, then 3 tabs for 2 days, 2 tabs for 2 days, then 1 tab by mouth daily for 2 days 08/14/21  Yes Gabbie Marzo R, NP  imiquimod (ALDARA) 5 % cream Apply topically at bedtime. Apply imiquimod every other night and wash off in the morning. Patient not taking: Reported on 08/14/2021 11/30/19   Jaynee Eagles, PA-C     Family History Family History  Problem Relation Age of Onset   Healthy Mother    Healthy Father     Social History Social History   Tobacco Use   Smoking status: Never   Smokeless tobacco: Never  Vaping Use   Vaping Use: Never used  Substance Use Topics   Alcohol use: Yes   Drug use: No     Allergies   Patient has no known allergies.   Review of Systems Review of Systems  Constitutional: Negative.   HENT: Negative.    Respiratory: Negative.    Cardiovascular: Negative.   Gastrointestinal:  Positive for abdominal pain. Negative for abdominal distention, anal bleeding, blood in stool, constipation, diarrhea, nausea, rectal pain and vomiting.  Skin:  Positive for rash. Negative for color change, pallor and wound.  Neurological: Negative.     Physical Exam Triage Vital Signs ED Triage Vitals  Enc Vitals Group     BP 08/14/21 1442 126/77     Pulse Rate 08/14/21 1442 86     Resp 08/14/21 1442 18     Temp 08/14/21 1442 99.7 F (37.6 C)     Temp Source 08/14/21 1442 Oral     SpO2 08/14/21 1442 96 %     Weight --      Height --      Head Circumference --      Peak Flow --      Pain Score 08/14/21 1439 4  Pain Loc --      Pain Edu? --      Excl. in [place]? --    No data found.  Updated Vital Signs BP 126/77 (BP Location: Right Arm)   Pulse 86   Temp 99.7 F (37.6 C) (Oral)   Resp 18   SpO2 96%   Visual Acuity Right Eye Distance:   Left Eye Distance:   Bilateral Distance:    Right Eye Near:   Left Eye Near:    Bilateral Near:     Physical Exam Constitutional:      Appearance: He is well-developed and normal weight.  Eyes:     Extraocular Movements: Extraocular movements intact.  Pulmonary:     Effort: Pulmonary effort is normal.  Abdominal:     General: Abdomen is flat. Bowel sounds are normal.     Palpations: Abdomen is soft.     Tenderness: There is no abdominal tenderness.  Skin:    Comments: Defer to photo below  Neurological:      Mental Status: He is alert and oriented to person, place, and time. Mental status is at baseline.  Psychiatric:        Mood and Affect: Mood normal.        Behavior: Behavior normal.      UC Treatments / Results  Labs (all labs ordered are listed, but only abnormal results are displayed) Labs Reviewed - No data to display  EKG   Radiology No results found.  Procedures Procedures (including critical care time)  Medications Ordered in UC Medications - No data to display  Initial Impression / Assessment and Plan / UC Course  I have reviewed the triage vital signs and the nursing notes.  Pertinent labs & imaging results that were available during my care of the patient were reviewed by me and considered in my medical decision making (see chart for details).  Rash  RLQ abdominal pain  Vital signs are stable, abdominal exam unremarkable, unable to reproduce pain, patient in no signs of distress, advised monitoring abdominal pain with urgent care follow-up as needed, advised that if abdominal pain returns and has increased in intensity to go to the nearest emergency department for further evaluation, unknown etiology of rash, discussed with patient, prescribed prednisone 60 mg taper, with follow-up as needed urgent care for persistent rash Final Clinical Impressions(s) / UC Diagnoses   Final diagnoses:  None   Discharge Instructions   None    ED Prescriptions     Medication Sig Dispense Auth. Provider   predniSONE (STERAPRED UNI-PAK 21 TAB) 10 MG (21) TBPK tablet Take by mouth daily. Take 6 tabs by mouth daily  for 2 days, then 5 tabs for 2 days, then 4 tabs for 2 days, then 3 tabs for 2 days, 2 tabs for 2 days, then 1 tab by mouth daily for 2 days 42 tablet Jammy Plotkin, Leitha Schuller, NP      PDMP not reviewed this encounter.   Hans Eden, NP 08/15/21 1047

## 2021-11-19 NOTE — Patient Instructions (Incomplete)
It was wonderful to see you today.  Please bring ALL of your medications with you to every visit.   Today we talked about:  Your referral was faxed to [facility] in July. Their contact information is below. You may call for an appointment.  Address: [address], [place], [place] [address] Phone: [phone]  We talked about measures of prevention today. PREP (pre-exposure prophylaxis) is a daily medication to prevent HIV (Human Immunodeficiency Virus).   Below is some information about the medication--most commonly called Truvada. This is a once daily pill.     Thank you for choosing [facility].   Please call [phone] with any questions about today's appointment.  Please be sure to schedule follow up at the front  desk before you leave today.   Sharion Settler, DO PGY-2 Family Medicine

## 2021-11-19 NOTE — Progress Notes (Deleted)
° ° °  SUBJECTIVE:   CHIEF COMPLAINT / HPI:   Lump on Neck Gerald Hamilton is a 26 y.o. male who presents to the [facility] clinic today to discuss lump on neck ***. He was initially seen for this on 10/14/20 and referred to ENT. Seen again on 04/19/21 due to insurance issues with referral. Reported that the mass had not increased in size at that time. Today, he reports ***      PERTINENT  PMH / PSH: Molluscum contagiosum   OBJECTIVE:   There were no vitals taken for this visit. ***  General: NAD, pleasant, able to participate in exam Cardiac: RRR, no murmurs. Respiratory: CTAB, normal effort, No wheezes, rales or rhonchi Abdomen: Bowel sounds present, nontender, nondistended, no hepatosplenomegaly. Extremities: no edema or cyanosis. Skin: warm and dry, no rashes noted Neuro: alert, no obvious focal deficits Psych: Normal affect and mood  ASSESSMENT/PLAN:   No problem-specific Assessment & Plan notes found for this encounter.   Dicussed benefits and side-effects of PrEP therapy. Patient would like to proceed. Will obtain baseline labs prior to sending prescription. If CrCl >60, will proceed with Truvada.  -HIV, RPR, CMP, HCV, HAV, HBV serologies  -3-site testing for GC/chlamydia  -Rx Truvada if appropriate -Will need f/u every 3 months to discuss adherence, inquire about side effects, retest for STI, RPR and HIV. -Will need repeat BMP 3 months after initiation and then every 6 months thereafter  Sharion Settler, [place]

## 2021-11-20 ENCOUNTER — Ambulatory Visit: Payer: Medicaid Other | Admitting: Family Medicine

## 2021-12-04 ENCOUNTER — Other Ambulatory Visit (HOSPITAL_COMMUNITY)
Admission: RE | Admit: 2021-12-04 | Discharge: 2021-12-04 | Disposition: A | Discharge status: Home / Self Care | Payer: Medicaid Other | Source: Ambulatory Visit | Attending: Family Medicine | Admitting: Family Medicine

## 2021-12-04 ENCOUNTER — Ambulatory Visit: Payer: Medicaid Other | Admitting: Family Medicine

## 2021-12-04 ENCOUNTER — Other Ambulatory Visit: Payer: Self-pay

## 2021-12-04 VITALS — BP 103/74 | HR 78 | Ht 63.0 in | Wt 130.1 lb

## 2021-12-04 DIAGNOSIS — Z7689 Persons encountering health services in other specified circumstances: Secondary | ICD-10-CM

## 2021-12-04 DIAGNOSIS — Z113 Encounter for screening for infections with a predominantly sexual mode of transmission: Secondary | ICD-10-CM

## 2021-12-04 DIAGNOSIS — Z1159 Encounter for screening for other viral diseases: Secondary | ICD-10-CM | POA: Diagnosis not present

## 2021-12-04 DIAGNOSIS — Z114 Encounter for screening for human immunodeficiency virus [HIV]: Secondary | ICD-10-CM

## 2021-12-04 DIAGNOSIS — B081 Molluscum contagiosum: Secondary | ICD-10-CM | POA: Diagnosis not present

## 2021-12-04 DIAGNOSIS — R221 Localized swelling, mass and lump, neck: Secondary | ICD-10-CM | POA: Diagnosis not present

## 2021-12-04 NOTE — Assessment & Plan Note (Signed)
HIV, hep C, RPR, urine GC/CT Patient is interested in prep, will obtain CMP, hepatitis panel.  Will likely initiate Truvada, but will look into ability to get cabotegravir extended-release injectable suspension, as adherence will likely be increased by every other month injection vs daily pill.

## 2021-12-04 NOTE — Assessment & Plan Note (Signed)
ENT referral placed again, discussed importance of following this up. As it has increased in size, will obtain US- ordered and scheduled.

## 2021-12-04 NOTE — Progress Notes (Deleted)
° ° °  SUBJECTIVE:   CHIEF COMPLAINT / HPI:    PERTINENT  PMH / PSH: ***  OBJECTIVE:   BP 103/74    Pulse 78    Ht 5\' 3"  (1.6 m)    Wt 130 lb 2 oz (59 kg)    SpO2 100%    BMI 23.05 kg/m   ***  ASSESSMENT/PLAN:   No problem-specific Assessment & Plan notes found for this encounter.     Gladys Damme, MD [place]

## 2021-12-04 NOTE — Assessment & Plan Note (Signed)
Risks and benefits of cryotherapy discussed, including redness, blistering and possibility of recurrence and need for repeat treatment. Patient verbalized understanding and agreed to procedure. Liquid nitrogen applied to 2 lesions visilble (refer to picture), both on chin.  - Post procedure instructions provided - Strict return precautions discussed

## 2021-12-04 NOTE — Progress Notes (Signed)
° ° °  SUBJECTIVE:   CHIEF COMPLAINT / HPI:   Lump on R neck: patient reports that he has had a lump behind his R ear on his neck for about 4 years. This has been steadily getting larger, and he has noticed it has gotten larger in the last month or so, now about 2 cm. He last saw Dr. Jeannine Kitten for this problem in January 2022, who referred him to ENT, but patient did not schedule appointment as he thought it was just "some water under [his] skin."   Molluscom contagiosum: patient has 1 and 2 mm lesions on chin, will treat with cryotherapy.  HC maintenance: patient is sexually active, MSM. Counseled on HPV vaccine, he declined 2nd dose after counseling. He is interested in PrEP, will obtain labs for this. Will do routine lab testing.   PERTINENT  PMH / PSH:   OBJECTIVE:   BP 103/74    Pulse 78    Ht 5\' 3"  (1.6 m)    Wt 130 lb 2 oz (59 kg)    SpO2 100%    BMI 23.05 kg/m   Nursing note and vitals reviewed GEN: young adult, LM, resting comfortably in chair, NAD, WNWD HEENT: NCAT. PERRLA. Sclera without injection or icterus. MMM. Clear oropharynx. 52mm verruca an 1 mm verrucae on chin. Neck: Supple. +LAD on R anterior neck, ~2cm, well circumscribed and mobile Cardiac: Regular rate and rhythm. Normal S1/S2. No murmurs, rubs, or gallops appreciated. 2+ radial pulses. Lungs: Clear bilaterally to ascultation. No increased WOB, no accessory muscle usage. No w/r/r. Neuro: AOx3  Ext: no edema Psych: Pleasant and appropriate    ASSESSMENT/PLAN:   Neck mass ENT referral placed again, discussed importance of following this up. As it has increased in size, will obtain US- ordered and scheduled.  Molluscum contagiosum Risks and benefits of cryotherapy discussed, including redness, blistering and possibility of recurrence and need for repeat treatment. Patient verbalized understanding and agreed to procedure. Liquid nitrogen applied to 2 lesions visilble (refer to picture), both on chin.  - Post procedure  instructions provided - Strict return precautions discussed  Encounter for assessment of STD exposure HIV, hep C, RPR, urine GC/CT Patient is interested in prep, will obtain CMP, hepatitis panel.  Will likely initiate Truvada, but will look into ability to get cabotegravir extended-release injectable suspension, as adherence will likely be increased by every other month injection vs daily pill.     Gladys Damme, MD [place]

## 2021-12-04 NOTE — Patient Instructions (Signed)
It was a pleasure to see you today!  We will get some labs today.  If they are abnormal or we need to do something about them, I will call you.  If they are normal, I will send you a message on MyChart (if it is active) or a letter in the mail.  If you don't hear from [place] in 2 weeks, please call the office  [phone]. We will schedule you for an ultrasound of your neck. Follow up will be based on results, but I recommend you see an ENT doctor (see below). I have placed a referral for an Ear-Nose-and-Throat (ENT) doctor. You should receive a phone call in 1-2 weeks to schedule this appointment. If for any reason you do not receive a phone call or need more help scheduling this appointment, please call our office at [phone]. Cryosurgery aftercare: this area should become red, crusty and should fall off. Please wash with soap and water to prevent infection. You may use vaseline for healing.  Consider the HPV vaccine as it prevents the most common causes of anal, penile, and cervical cancer. Also consider PrEP, there is a new injection that is every 1-2 months instead of a daily pill.   Be Well,  Dr. Chauncey Reading

## 2021-12-05 LAB — COMPREHENSIVE METABOLIC PANEL
ALT: 27 IU/L (ref 0–44)
AST: 23 IU/L (ref 0–40)
Albumin/Globulin Ratio: 2 (ref 1.2–2.2)
Albumin: 5.1 g/dL (ref 4.1–5.2)
Alkaline Phosphatase: 60 IU/L (ref 44–121)
BUN/Creatinine Ratio: 11 (ref 9–20)
BUN: 9 mg/dL (ref 6–20)
Bilirubin Total: 0.6 mg/dL (ref 0.0–1.2)
CO2: 26 mmol/L (ref 20–29)
Calcium: 10.2 mg/dL (ref 8.7–10.2)
Chloride: 98 mmol/L (ref 96–106)
Creatinine, Ser: 0.82 mg/dL (ref 0.76–1.27)
Globulin, Total: 2.6 g/dL (ref 1.5–4.5)
Glucose: 87 mg/dL (ref 70–99)
Potassium: 4.4 mmol/L (ref 3.5–5.2)
Sodium: 140 mmol/L (ref 134–144)
Total Protein: 7.7 g/dL (ref 6.0–8.5)
eGFR: 127 mL/min/{1.73_m2} (ref >59)

## 2021-12-05 LAB — HCV INTERPRETATION

## 2021-12-05 LAB — RPR: RPR Ser Ql: NONREACTIVE

## 2021-12-05 LAB — HEPATITIS B SURFACE ANTIBODY,QUALITATIVE: Hep B Surface Ab, Qual: NONREACTIVE

## 2021-12-05 LAB — URINE CYTOLOGY ANCILLARY ONLY
Chlamydia: NEGATIVE
Comment: NEGATIVE
Comment: NEGATIVE
Comment: NORMAL
Neisseria Gonorrhea: NEGATIVE
Trichomonas: NEGATIVE

## 2021-12-05 LAB — HEPATITIS B CORE AB W/REFLEX: Hep B Core Total Ab: NEGATIVE

## 2021-12-05 LAB — CBC WITH DIFFERENTIAL/PLATELET
Basophils Absolute: 0 10*3/uL (ref 0.0–0.2)
Basos: 0 %
EOS (ABSOLUTE): 0 10*3/uL (ref 0.0–0.4)
Eos: 1 %
Hematocrit: 41.9 % (ref 37.5–51.0)
Hemoglobin: 14.3 g/dL (ref 13.0–17.7)
Immature Grans (Abs): 0 10*3/uL (ref 0.0–0.1)
Immature Granulocytes: 0 %
Lymphocytes Absolute: 1.1 10*3/uL (ref 0.7–3.1)
Lymphs: 16 %
MCH: 31.2 pg (ref 26.6–33.0)
MCHC: 34.1 g/dL (ref 31.5–35.7)
MCV: 91 fL (ref 79–97)
Monocytes Absolute: 0.3 10*3/uL (ref 0.1–0.9)
Monocytes: 5 %
Neutrophils Absolute: 5.4 10*3/uL (ref 1.4–7.0)
Neutrophils: 78 %
Platelets: 199 10*3/uL (ref 150–450)
RBC: 4.59 x10E6/uL (ref 4.14–5.80)
RDW: 11.8 % (ref 11.6–15.4)
WBC: 6.9 10*3/uL (ref 3.4–10.8)

## 2021-12-05 LAB — HCV AB W REFLEX TO QUANT PCR: HCV Ab: NONREACTIVE

## 2021-12-05 LAB — HEPATITIS B SURFACE ANTIGEN: Hepatitis B Surface Ag: NEGATIVE

## 2021-12-05 LAB — HIV ANTIBODY (ROUTINE TESTING W REFLEX): HIV Screen 4th Generation wRfx: NONREACTIVE

## 2021-12-05 LAB — HEPATITIS A ANTIBODY, TOTAL: hep A Total Ab: POSITIVE — AB

## 2021-12-12 ENCOUNTER — Other Ambulatory Visit: Payer: Self-pay

## 2021-12-12 ENCOUNTER — Ambulatory Visit (HOSPITAL_COMMUNITY)
Admission: RE | Admit: 2021-12-12 | Discharge: 2021-12-12 | Disposition: A | Discharge status: Home / Self Care | Payer: Medicaid Other | Source: Ambulatory Visit | Attending: Family Medicine | Admitting: Family Medicine

## 2021-12-12 DIAGNOSIS — R221 Localized swelling, mass and lump, neck: Secondary | ICD-10-CM | POA: Insufficient documentation

## 2021-12-24 ENCOUNTER — Telehealth: Payer: Self-pay | Admitting: Family Medicine

## 2021-12-24 DIAGNOSIS — Z114 Encounter for screening for human immunodeficiency virus [HIV]: Secondary | ICD-10-CM

## 2021-12-24 NOTE — Telephone Encounter (Signed)
Discussed results with Glenard Haring, that US showed a possible dermoid cyst, but because of location and still could be LN, I discussed with preceptor, Dr. Gwendlyn Deutscher, would still refer patient to ENT for removal . Gave patient referral information for [facility].  ? ?I also discussed blood work results for PrEP. He is eligible for PrEP and he would like the injectable. I discussed with pharmacy that we cannot get access to injectable PrEP at this time. Dr. Valentina Lucks recommended referral to [facility] for PrEP. He would still like this, will place referral. ? ?Gladys Damme, MD ?[facility], PGY-3 ? ?

## 2021-12-26 ENCOUNTER — Other Ambulatory Visit (HOSPITAL_COMMUNITY): Payer: Self-pay

## 2021-12-27 ENCOUNTER — Ambulatory Visit (INDEPENDENT_AMBULATORY_CARE_PROVIDER_SITE_OTHER): Payer: Medicaid Other | Admitting: Pharmacist

## 2021-12-27 ENCOUNTER — Other Ambulatory Visit (HOSPITAL_COMMUNITY)
Admission: RE | Admit: 2021-12-27 | Discharge: 2021-12-27 | Disposition: A | Discharge status: Home / Self Care | Payer: Medicaid Other | Source: Ambulatory Visit | Attending: Infectious Disease | Admitting: Infectious Disease

## 2021-12-27 ENCOUNTER — Other Ambulatory Visit (HOSPITAL_COMMUNITY): Payer: Self-pay

## 2021-12-27 ENCOUNTER — Other Ambulatory Visit: Payer: Self-pay

## 2021-12-27 DIAGNOSIS — Z113 Encounter for screening for infections with a predominantly sexual mode of transmission: Secondary | ICD-10-CM | POA: Diagnosis not present

## 2021-12-27 DIAGNOSIS — Z79899 Other long term (current) drug therapy: Secondary | ICD-10-CM | POA: Diagnosis not present

## 2021-12-27 DIAGNOSIS — Z23 Encounter for immunization: Secondary | ICD-10-CM

## 2021-12-27 MED ORDER — DESCOVY 200-25 MG PO TABS
1.0000 | ORAL_TABLET | Freq: Every day | ORAL | 3 refills | Status: DC
  Filled 2021-12-27: qty 30, 30d supply, fill #0

## 2021-12-27 NOTE — Progress Notes (Signed)
? ?Date:  12/27/2021  ? ?HPI: Allister Lessley is a 26 y.o. male who presents to the [place] clinic to discuss and initiate PrEP. ? ?Insured   '[x]'$    Uninsured  '[]'$   ? ?Patient Active Problem List  ? Diagnosis Date Noted  ? Molluscum contagiosum 04/21/2021  ? Neck mass 10/14/2020  ? Encounter for assessment of STD exposure 10/14/2020  ? ? ?Patient's Medications  ?New Prescriptions  ? EMTRICITABINE-TENOFOVIR AF (DESCOVY) 200-25 MG TABLET    Take 1 tablet by mouth daily.  ?Previous Medications  ? IMIQUIMOD (ALDARA) 5 % CREAM    Apply topically at bedtime. Apply imiquimod every other night and wash off in the morning.  ? PREDNISONE (STERAPRED UNI-PAK 21 TAB) 10 MG (21) TBPK TABLET    Take by mouth daily. Take 6 tabs by mouth daily  for 2 days, then 5 tabs for 2 days, then 4 tabs for 2 days, then 3 tabs for 2 days, 2 tabs for 2 days, then 1 tab by mouth daily for 2 days  ?Modified Medications  ? No medications on file  ?Discontinued Medications  ? No medications on file  ? ? ?Allergies: ?No Known Allergies ? ?Past Medical History: ?No past medical history on file. ? ?Social History: ?Social History  ? ?Socioeconomic History  ? Marital status: Single  ?  Spouse name: Not on file  ? Number of children: Not on file  ? Years of education: Not on file  ? Highest education level: Not on file  ?Occupational History  ? Not on file  ?Tobacco Use  ? Smoking status: Never  ? Smokeless tobacco: Never  ?Vaping Use  ? Vaping Use: Never used  ?Substance and Sexual Activity  ? Alcohol use: Yes  ? Drug use: No  ? Sexual activity: Never  ?Other Topics Concern  ? Not on file  ?Social History Narrative  ? Not on file  ? ?Social Determinants of Health  ? ?Financial Resource Strain: Not on file  ?Food Insecurity: Not on file  ?Transportation Needs: Not on file  ?Physical Activity: Not on file  ?Stress: Not on file  ?Social Connections: Not on file  ? ? ? ?  12/27/2021  ?  2:36 PM  ?CHL HIV PREP FLOWSHEET RESULTS  ?Insurance Status Insured  ?How  did you hear? [place]  ?Gender at birth Male  ?Gender identity cis-Male  ?Sex Partners Men only  ?Sex activity preferences Receptive;Oral  ?Condom use Yes  ?% condom use 80  ?Treated for STI? No  ?HIV symptoms? None  ?PrEP Eligibility Yes  ?Paper work received? No  ? ? ?Labs: ? ?SCr: ?Lab Results  ?Component Value Date  ? CREATININE 0.82 12/04/2021  ? ?HIV ?Lab Results  ?Component Value Date  ? HIV Non Reactive 12/04/2021  ? HIV Non Reactive 10/14/2020  ? ?Hepatitis B ?Lab Results  ?Component Value Date  ? HEPBSAG Negative 12/04/2021  ? HEPBCAB Negative 12/04/2021  ? ?Hepatitis C ?No results found for: [place], HCVRNAPCRQN ?Hepatitis A ?Lab Results  ?Component Value Date  ? HAV Positive (A) 12/04/2021  ? ?RPR and STI ?Lab Results  ?Component Value Date  ? LABRPR Non Reactive 12/04/2021  ? LABRPR Non Reactive 10/14/2020  ? ? ?STI Results GC CT  ?12/04/2021 ?11:01 AM Negative   Negative    ?10/14/2020 ?10:52 AM Negative   Negative    ? ? ?Assessment: ?Blaise presents today after being referred from the [facility] to initiate PrEP. He was originally interested in  starting Apretude, but he states that he Ubers to his appointments so he is worried he may not be able make frequent appointments.  ? ?He states he has not been sexually active in the past 6 months but endorses he sexual preferences include men and he is the bottom partner. Reported condom use of 80%. He had a urine cytology done 2/27 which was negative. Will get oral and rectal cytologies today.  ? ?Kemonte had a CBC and CMET on 12/04/21 which was unremarkable. Will also get baseline lipid panel today before starting Descovy.  ? ?He is due for Hep B vaccine (Hep B Ab negative 12/04/21) and second HPV vaccine. Will give both today.  ? ?Plan: ?- Start Descovy if HIV antibody negative ?- Hep B and HPV vaccine today ?- Second Hep B vaccine in 1 month ?- Third & final HPV vaccine in 6 months  ?- Lipid panel ?- Follow up 03/29/2022 with Estill Bamberg ? ?Joseph Art, Pharm.D. ?PGY-1  Pharmacy Resident ?12/27/2021 3:57 PM ?

## 2021-12-28 ENCOUNTER — Other Ambulatory Visit (HOSPITAL_COMMUNITY): Payer: Self-pay

## 2021-12-28 ENCOUNTER — Other Ambulatory Visit: Payer: Self-pay

## 2021-12-28 DIAGNOSIS — Z79899 Other long term (current) drug therapy: Secondary | ICD-10-CM

## 2021-12-28 LAB — LIPID PANEL
Cholesterol: 198 mg/dL (ref <200)
HDL: 79 mg/dL (ref >=40)
LDL Cholesterol (Calc): 105 mg/dL (calc) — ABNORMAL HIGH
Non-HDL Cholesterol (Calc): 119 mg/dL (calc) (ref <130)
Total CHOL/HDL Ratio: 2.5 (calc) (ref <5.0)
Triglycerides: 57 mg/dL (ref <150)

## 2021-12-28 LAB — CYTOLOGY, (ORAL, ANAL, URETHRAL) ANCILLARY ONLY
Chlamydia: NEGATIVE
Chlamydia: NEGATIVE
Comment: NEGATIVE
Comment: NEGATIVE
Comment: NORMAL
Comment: NORMAL
Neisseria Gonorrhea: NEGATIVE
Neisseria Gonorrhea: NEGATIVE

## 2021-12-28 LAB — HIV ANTIBODY (ROUTINE TESTING W REFLEX): HIV 1&2 Ab, 4th Generation: NONREACTIVE

## 2021-12-28 MED ORDER — DESCOVY 200-25 MG PO TABS
1.0000 | ORAL_TABLET | Freq: Every day | ORAL | 2 refills | Status: DC
  Filled 2021-12-28 (×2): qty 30, 30d supply, fill #0
  Filled 2022-02-05: qty 30, 30d supply, fill #1

## 2022-01-01 ENCOUNTER — Other Ambulatory Visit (HOSPITAL_COMMUNITY): Payer: Self-pay

## 2022-01-11 ENCOUNTER — Other Ambulatory Visit (HOSPITAL_COMMUNITY): Payer: Self-pay

## 2022-01-16 ENCOUNTER — Other Ambulatory Visit (HOSPITAL_COMMUNITY): Payer: Self-pay

## 2022-01-16 DIAGNOSIS — D234 Other benign neoplasm of skin of scalp and neck: Secondary | ICD-10-CM | POA: Diagnosis not present

## 2022-01-16 DIAGNOSIS — Z87891 Personal history of nicotine dependence: Secondary | ICD-10-CM | POA: Diagnosis not present

## 2022-01-18 ENCOUNTER — Other Ambulatory Visit (HOSPITAL_COMMUNITY): Payer: Self-pay

## 2022-01-30 ENCOUNTER — Other Ambulatory Visit: Payer: Self-pay

## 2022-01-30 ENCOUNTER — Ambulatory Visit (INDEPENDENT_AMBULATORY_CARE_PROVIDER_SITE_OTHER): Payer: Medicaid Other | Admitting: Pharmacist

## 2022-01-30 DIAGNOSIS — Z23 Encounter for immunization: Secondary | ICD-10-CM

## 2022-01-30 NOTE — Progress Notes (Signed)
? ?Date:  01/30/2022  ? ?HPI: Gerald Hamilton is a 26 y.o. male who presents to the [place] clinic for Hepatitis B vaccination and HIV PrEP follow-up. ? ?Insured   '[x]'$    Uninsured  '[]'$   ? ?Patient Active Problem List  ? Diagnosis Date Noted  ? Molluscum contagiosum 04/21/2021  ? Neck mass 10/14/2020  ? Encounter for assessment of STD exposure 10/14/2020  ? ? ?Patient's Medications  ?New Prescriptions  ? No medications on file  ?Previous Medications  ? EMTRICITABINE-TENOFOVIR AF (DESCOVY) 200-25 MG TABLET    Take 1 tablet by mouth daily.  ? IMIQUIMOD (ALDARA) 5 % CREAM    Apply topically at bedtime. Apply imiquimod every other night and wash off in the morning.  ? PREDNISONE (STERAPRED UNI-PAK 21 TAB) 10 MG (21) TBPK TABLET    Take by mouth daily. Take 6 tabs by mouth daily  for 2 days, then 5 tabs for 2 days, then 4 tabs for 2 days, then 3 tabs for 2 days, 2 tabs for 2 days, then 1 tab by mouth daily for 2 days  ?Modified Medications  ? No medications on file  ?Discontinued Medications  ? No medications on file  ? ? ?Allergies: ?No Known Allergies ? ?Past Medical History: ?No past medical history on file. ? ?Social History: ?Social History  ? ?Socioeconomic History  ? Marital status: Single  ?  Spouse name: Not on file  ? Number of children: Not on file  ? Years of education: Not on file  ? Highest education level: Not on file  ?Occupational History  ? Not on file  ?Tobacco Use  ? Smoking status: Never  ? Smokeless tobacco: Never  ?Vaping Use  ? Vaping Use: Never used  ?Substance and Sexual Activity  ? Alcohol use: Yes  ? Drug use: No  ? Sexual activity: Never  ?Other Topics Concern  ? Not on file  ?Social History Narrative  ? Not on file  ? ?Social Determinants of Health  ? ?Financial Resource Strain: Not on file  ?Food Insecurity: Not on file  ?Transportation Needs: Not on file  ?Physical Activity: Not on file  ?Stress: Not on file  ?Social Connections: Not on file  ? ? ? ?  12/27/2021  ?  2:36 PM  ?CHL HIV PREP  FLOWSHEET RESULTS  ?Insurance Status Insured  ?How did you hear? [place]  ?Gender at birth Male  ?Gender identity cis-Male  ?Sex Partners Men only  ?Sex activity preferences Receptive;Oral  ?Condom use Yes  ?% condom use 80  ?Treated for STI? No  ?HIV symptoms? None  ?PrEP Eligibility Yes  ?Paper work received? No  ? ? ?Labs: ? ?SCr: ?Lab Results  ?Component Value Date  ? CREATININE 0.82 12/04/2021  ? ?HIV ?Lab Results  ?Component Value Date  ? HIV NON-REACTIVE 12/27/2021  ? HIV Non Reactive 12/04/2021  ? HIV Non Reactive 10/14/2020  ? ?Hepatitis B ?Lab Results  ?Component Value Date  ? HEPBSAG Negative 12/04/2021  ? HEPBCAB Negative 12/04/2021  ? ?Hepatitis C ?No results found for: [place], HCVRNAPCRQN ?Hepatitis A ?Lab Results  ?Component Value Date  ? HAV Positive (A) 12/04/2021  ? ?RPR and STI ?Lab Results  ?Component Value Date  ? LABRPR Non Reactive 12/04/2021  ? LABRPR Non Reactive 10/14/2020  ? ? ?STI Results GC CT  ?12/27/2021 ? 2:31 PM Negative    ? Negative   Negative    ? Negative    ?12/04/2021 ?11:01 AM Negative  Negative    ?10/14/2020 ?10:52 AM Negative   Negative    ? ? ?Assessment: ?Gerald Hamilton presents to clinic today for HIV PrEP follow up and Hepatitis B 2/2 vaccination. At last visit on 3/22, patient was started on Descovy for PrEP and received Hepatitis B 1/2 vaccine. Will administer Hepatitis B 2/2 vaccine today. Check Hep B immunity at next visit. ? ?Patient reports that due to incorrect address in the system, he did not receive his Descovy until a few weeks ago and just started taking it about 5 days ago. He reports tolerating medication well, with no side effects. Denies fatigue, nausea, and GI upset. However, patient reports having missed 1 dose since starting due to forgetfulness. Discussed with patient that with Descovy it is important to take every day in order to help prevent contracting HIV. Discussed medication adherence methods and picking a certain time of the day to take his medication.  Offered/gave patient a pill box to help with medication adherence. Informed patient to let clinic know if he has issues getting refills or if he has any side effects. Although patient recently started medication, will keep 6/22 appointment to assess medication tolerance/adherence.  ? ?Plan: ?- Administered Hepatitis B 2/2 vaccine today. ?- Check Hepatitis B antibody at next visit. ?- Follow up on 6/22 with Estill Bamberg for HIV PrEP and medication adherence ? ?Gerald Hamilton, PharmD Candidate ?01/30/2022 3:14 PM ? ?

## 2022-02-05 ENCOUNTER — Other Ambulatory Visit (HOSPITAL_COMMUNITY): Payer: Self-pay

## 2022-02-15 ENCOUNTER — Other Ambulatory Visit (HOSPITAL_COMMUNITY): Payer: Self-pay

## 2022-02-22 ENCOUNTER — Other Ambulatory Visit: Payer: Self-pay | Admitting: Otolaryngology

## 2022-02-22 ENCOUNTER — Other Ambulatory Visit: Payer: Self-pay

## 2022-02-22 ENCOUNTER — Encounter (HOSPITAL_BASED_OUTPATIENT_CLINIC_OR_DEPARTMENT_OTHER): Payer: Self-pay | Admitting: Otolaryngology

## 2022-02-28 ENCOUNTER — Ambulatory Visit (HOSPITAL_BASED_OUTPATIENT_CLINIC_OR_DEPARTMENT_OTHER)
Admission: RE | Admit: 2022-02-28 | Discharge: 2022-02-28 | Disposition: A | Discharge status: Home / Self Care | Payer: Medicaid Other | Attending: Otolaryngology | Admitting: Otolaryngology

## 2022-02-28 ENCOUNTER — Encounter (HOSPITAL_BASED_OUTPATIENT_CLINIC_OR_DEPARTMENT_OTHER)
Admission: RE | Disposition: A | Discharge status: Home / Self Care | Payer: Self-pay | Source: Home / Self Care | Attending: Otolaryngology

## 2022-02-28 ENCOUNTER — Ambulatory Visit (HOSPITAL_BASED_OUTPATIENT_CLINIC_OR_DEPARTMENT_OTHER): Payer: Medicaid Other | Admitting: Anesthesiology

## 2022-02-28 ENCOUNTER — Other Ambulatory Visit: Payer: Self-pay

## 2022-02-28 ENCOUNTER — Encounter (HOSPITAL_BASED_OUTPATIENT_CLINIC_OR_DEPARTMENT_OTHER): Payer: Self-pay | Admitting: Otolaryngology

## 2022-02-28 DIAGNOSIS — L723 Sebaceous cyst: Secondary | ICD-10-CM

## 2022-02-28 DIAGNOSIS — L720 Epidermal cyst: Secondary | ICD-10-CM | POA: Insufficient documentation

## 2022-02-28 DIAGNOSIS — D367 Benign neoplasm of other specified sites: Secondary | ICD-10-CM | POA: Diagnosis not present

## 2022-02-28 DIAGNOSIS — Z01818 Encounter for other preprocedural examination: Secondary | ICD-10-CM

## 2022-02-28 DIAGNOSIS — R221 Localized swelling, mass and lump, neck: Secondary | ICD-10-CM

## 2022-02-28 HISTORY — DX: Benign neoplasm of other specified sites: D36.7

## 2022-02-28 HISTORY — PX: PREAURICULAR CYST EXCISION: SHX2264

## 2022-02-28 SURGERY — EXCISION, CYST, PREAURICULAR
Anesthesia: General | Site: Neck | Laterality: Right

## 2022-02-28 MED ORDER — LIDOCAINE 2% (20 MG/ML) 5 ML SYRINGE
INTRAMUSCULAR | Status: AC
  Filled 2022-02-28: qty 5

## 2022-02-28 MED ORDER — CEFAZOLIN SODIUM-DEXTROSE 2-4 GM/100ML-% IV SOLN
2.0000 g | INTRAVENOUS | Status: AC
  Administered 2022-02-28: 2 g via INTRAVENOUS

## 2022-02-28 MED ORDER — MIDAZOLAM HCL 2 MG/2ML IJ SOLN
INTRAMUSCULAR | Status: AC
  Filled 2022-02-28: qty 2

## 2022-02-28 MED ORDER — OXYCODONE HCL 5 MG/5ML PO SOLN: 5.0000 mg | Freq: Once | ORAL | Status: DC | PRN

## 2022-02-28 MED ORDER — LIDOCAINE-EPINEPHRINE 1 %-1:100000 IJ SOLN
INTRAMUSCULAR | Status: DC | PRN
  Administered 2022-02-28: 5 mL

## 2022-02-28 MED ORDER — DEXAMETHASONE SODIUM PHOSPHATE 10 MG/ML IJ SOLN
INTRAMUSCULAR | Status: AC
  Filled 2022-02-28: qty 1

## 2022-02-28 MED ORDER — FENTANYL CITRATE (PF) 100 MCG/2ML IJ SOLN
INTRAMUSCULAR | Status: DC | PRN
Start: 2022-02-28 — End: 2022-02-28
  Administered 2022-02-28 (×2): 50 ug via INTRAVENOUS

## 2022-02-28 MED ORDER — LACTATED RINGERS IV SOLN: INTRAVENOUS | Status: DC

## 2022-02-28 MED ORDER — ONDANSETRON HCL 4 MG/2ML IJ SOLN
INTRAMUSCULAR | Status: DC | PRN
Start: 2022-02-28 — End: 2022-02-28
  Administered 2022-02-28: 4 mg via INTRAVENOUS

## 2022-02-28 MED ORDER — PROPOFOL 10 MG/ML IV BOLUS
INTRAVENOUS | Status: AC
  Filled 2022-02-28: qty 20

## 2022-02-28 MED ORDER — OXYCODONE HCL 5 MG PO TABS: 5.0000 mg | ORAL_TABLET | Freq: Once | ORAL | Status: DC | PRN

## 2022-02-28 MED ORDER — ONDANSETRON HCL 4 MG/2ML IJ SOLN
INTRAMUSCULAR | Status: AC
  Filled 2022-02-28: qty 2

## 2022-02-28 MED ORDER — ONDANSETRON HCL 4 MG/2ML IJ SOLN: 4.0000 mg | Freq: Four times a day (QID) | INTRAMUSCULAR | Status: DC | PRN

## 2022-02-28 MED ORDER — 0.9 % SODIUM CHLORIDE (POUR BTL) OPTIME
TOPICAL | Status: DC | PRN
  Administered 2022-02-28: 20 mL

## 2022-02-28 MED ORDER — FENTANYL CITRATE (PF) 100 MCG/2ML IJ SOLN: 25.0000 ug | INTRAMUSCULAR | Status: DC | PRN

## 2022-02-28 MED ORDER — DEXAMETHASONE SODIUM PHOSPHATE 10 MG/ML IJ SOLN
INTRAMUSCULAR | Status: DC | PRN
  Administered 2022-02-28: 5 mg via INTRAVENOUS

## 2022-02-28 MED ORDER — FENTANYL CITRATE (PF) 100 MCG/2ML IJ SOLN
INTRAMUSCULAR | Status: AC
  Filled 2022-02-28: qty 2

## 2022-02-28 MED ORDER — PROPOFOL 10 MG/ML IV BOLUS
INTRAVENOUS | Status: DC | PRN
  Administered 2022-02-28: 200 mg via INTRAVENOUS

## 2022-02-28 MED ORDER — MIDAZOLAM HCL 5 MG/5ML IJ SOLN
INTRAMUSCULAR | Status: DC | PRN
  Administered 2022-02-28 (×2): 1 mg via INTRAVENOUS

## 2022-02-28 MED ORDER — CEFAZOLIN SODIUM-DEXTROSE 2-4 GM/100ML-% IV SOLN
INTRAVENOUS | Status: AC
  Filled 2022-02-28: qty 100

## 2022-02-28 MED ORDER — LIDOCAINE HCL 1 % IJ SOLN
INTRAMUSCULAR | Status: DC | PRN
  Administered 2022-02-28: 550 mg via INTRADERMAL

## 2022-02-28 SURGICAL SUPPLY — 64 items
ADH SKN CLS APL DERMABOND .7 (GAUZE/BANDAGES/DRESSINGS)
APL SKNCLS STERI-STRIP NONHPOA (GAUZE/BANDAGES/DRESSINGS)
BAND INSRT 18 STRL LF DISP RB (MISCELLANEOUS)
BAND RUBBER #18 3X1/16 STRL (MISCELLANEOUS) IMPLANT
BENZOIN TINCTURE PRP APPL 2/3 (GAUZE/BANDAGES/DRESSINGS) IMPLANT
BLADE CLIPPER SURG (BLADE) IMPLANT
BLADE SURG 15 STRL LF DISP TIS (BLADE) ×1 IMPLANT
BLADE SURG 15 STRL SS (BLADE) ×2
CANISTER SUCT 1200ML W/VALVE (MISCELLANEOUS) ×2 IMPLANT
CLEANER CAUTERY TIP 5X5 PAD (MISCELLANEOUS) IMPLANT
CORD BIPOLAR FORCEPS 12FT (ELECTRODE) IMPLANT
COVER BACK TABLE 60X90IN (DRAPES) ×2 IMPLANT
COVER MAYO STAND STRL (DRAPES) ×2 IMPLANT
DERMABOND ADVANCED (GAUZE/BANDAGES/DRESSINGS)
DERMABOND ADVANCED .7 DNX12 (GAUZE/BANDAGES/DRESSINGS) IMPLANT
DRAPE U-SHAPE 76X120 STRL (DRAPES) ×2 IMPLANT
ELECT COATED BLADE 2.86 ST (ELECTRODE) IMPLANT
ELECT NDL TIP 2.8 STRL (NEEDLE) IMPLANT
ELECT NEEDLE TIP 2.8 STRL (NEEDLE) IMPLANT
ELECT REM PT RETURN 9FT ADLT (ELECTROSURGICAL) ×2
ELECTRODE REM PT RTRN 9FT ADLT (ELECTROSURGICAL) ×1 IMPLANT
FORCEPS BIPOLAR SPETZLER 8 1.0 (NEUROSURGERY SUPPLIES) IMPLANT
GAUZE 4X4 16PLY ~~LOC~~+RFID DBL (SPONGE) IMPLANT
GAUZE SPONGE 4X4 12PLY STRL (GAUZE/BANDAGES/DRESSINGS) IMPLANT
GAUZE SPONGE 4X4 12PLY STRL LF (GAUZE/BANDAGES/DRESSINGS) IMPLANT
GLOVE BIO SURGEON STRL SZ 6.5 (GLOVE) ×2 IMPLANT
GOWN STRL REUS W/ TWL LRG LVL3 (GOWN DISPOSABLE) ×2 IMPLANT
GOWN STRL REUS W/TWL LRG LVL3 (GOWN DISPOSABLE) ×4
HEMOSTAT ARISTA ABSORB 3G PWDR (HEMOSTASIS) IMPLANT
NDL HYPO 27GX1-1/4 (NEEDLE) ×1 IMPLANT
NEEDLE HYPO 27GX1-1/4 (NEEDLE) ×2 IMPLANT
NS IRRIG 1000ML POUR BTL (IV SOLUTION) ×2 IMPLANT
PACK BASIN DAY SURGERY FS (CUSTOM PROCEDURE TRAY) ×2 IMPLANT
PAD CLEANER CAUTERY TIP 5X5 (MISCELLANEOUS)
PENCIL SMOKE EVACUATOR (MISCELLANEOUS) ×2 IMPLANT
SHEET MEDIUM DRAPE 40X70 STRL (DRAPES) IMPLANT
SLEEVE SCD COMPRESS KNEE MED (STOCKING) ×2 IMPLANT
SPIKE FLUID TRANSFER (MISCELLANEOUS) IMPLANT
SPONGE INTESTINAL PEANUT (DISPOSABLE) IMPLANT
STAPLER VISISTAT 35W (STAPLE) IMPLANT
STRIP CLOSURE SKIN 1/2X4 (GAUZE/BANDAGES/DRESSINGS) IMPLANT
STRIP CLOSURE SKIN 1/4X4 (GAUZE/BANDAGES/DRESSINGS) IMPLANT
SUCTION FRAZIER HANDLE 10FR (MISCELLANEOUS)
SUCTION TUBE FRAZIER 10FR DISP (MISCELLANEOUS) IMPLANT
SUT ETHILON 3 0 PS 1 (SUTURE) IMPLANT
SUT ETHILON 4 0 P 3 18 (SUTURE) IMPLANT
SUT ETHILON 5 0 P 3 18 (SUTURE)
SUT MNCRL AB 4-0 PS2 18 (SUTURE) IMPLANT
SUT NYLON ETHILON 5-0 P-3 1X18 (SUTURE) IMPLANT
SUT SILK 2 0 SH (SUTURE) IMPLANT
SUT SILK 3 0 TIES 17X18 (SUTURE)
SUT SILK 3-0 18XBRD TIE BLK (SUTURE) IMPLANT
SUT SILK 4 0 TIES 17X18 (SUTURE) ×2 IMPLANT
SUT VIC AB 3-0 FS2 27 (SUTURE) IMPLANT
SUT VICRYL 3-0 CR8 SH (SUTURE) IMPLANT
SUT VICRYL 4-0 PS2 18IN ABS (SUTURE) IMPLANT
SWAB COLLECTION DEVICE MRSA (MISCELLANEOUS) IMPLANT
SWAB CULTURE ESWAB REG 1ML (MISCELLANEOUS) IMPLANT
SYR BULB EAR ULCER 3OZ GRN STR (SYRINGE) ×2 IMPLANT
SYR CONTROL 10ML LL (SYRINGE) ×2 IMPLANT
TAPE PAPER 3X10 WHT MICROPORE (GAUZE/BANDAGES/DRESSINGS) ×2 IMPLANT
TOWEL GREEN STERILE FF (TOWEL DISPOSABLE) ×2 IMPLANT
TRAY DSU PREP LF (CUSTOM PROCEDURE TRAY) ×2 IMPLANT
TUBE CONNECTING 20X1/4 (TUBING) ×2 IMPLANT

## 2022-02-28 NOTE — Discharge Instructions (Signed)

## 2022-02-28 NOTE — Op Note (Signed)
OPERATIVE NOTE  Gerald Hamilton Date/Time of Admission: 02/28/2022 11:21 AM  CSN: [id number];LFY:[id number] Attending Provider: Ebbie Latus A, DO Room/Bed: MCSP/NONE DOB: Oct 26, 1995 Age: 26 y.o.   Pre-Op Diagnosis: Cyst of neck  Post-Op Diagnosis: Cyst of neck  Procedure: Procedure(s): EXCISION OF SUPERFICIAL LESION OF NECK (CPT 11422)  Anesthesia: General  Surgeon(s): Nectar, DO  Staff: Circulator: Patric Dykes, RN Scrub Person: [name], [name]  Implants: * No implants in log *  Specimens: ID Type Source Tests Collected by Time Destination  1 : right neck cyst Tissue PATH Soft tissue SURGICAL PATHOLOGY [name] A, DO 8/52/7782 [id number]     Complications: None  EBL: <5 ML  IVF: 650 ML  Condition: stable  Operative Findings:  Cystic lesion of right neck with associated pore, consistent with sebaceous cyst  Description of Operation: Once the operative consent was obtained and the site and surgery were confirmed with the patient and the operating room team, the patient was brought back to the operating room and IV sedation was smoothly obtained. Local anesthesia, 1% lidocaine with 1:100,000 epinephrine was injected into the lesion, 6 mL total. The patient was prepped and draped in sterile fashion and turned over to the ENT service. The skin lesion was identified and an ellipse was planned. The total planned defect was 2 x 1 cm.  The incision was made with a 15 blade and the mass was dissected from the surrounding tissue using iris scissors and bipolar cautery. The excised specimen was sent to pathology for permanent section. The defect was copiously irrigated. Hemostasis was obtained with bipolar electrocautery.  The skin was then closed with interrupted 4-0 Vicryl sutures in the deep tissues and the dermis. Derma bond was placed on the skin.  The patient was turned over to the anesthesia service. The patient was transferred to the PACU in stable  condition.    [name], [facility]  02/28/2022

## 2022-02-28 NOTE — Transfer of Care (Signed)
Immediate Anesthesia Transfer of Care Note  Patient: Gerald Hamilton  Procedure(s) Performed: EXCISION OF SUPERFICIAL LESION OF NECK (Right: Neck)  Patient Location: PACU  Anesthesia Type:General  Level of Consciousness: oriented, drowsy and patient cooperative  Airway & Oxygen Therapy: Patient Spontanous Breathing and Patient connected to nasal cannula oxygen  Post-op Assessment: Report given to RN and Post -op Vital signs reviewed and stable  Post vital signs: Reviewed and stable  Last Vitals:  Vitals Value Taken Time  BP 127/83 02/28/22 1326  Temp 36.4 C 02/28/22 1326  Pulse 87 02/28/22 1327  Resp 15 02/28/22 1327  SpO2 100 % 02/28/22 1327  Vitals shown include unvalidated device data.  Last Pain:  Vitals:   02/28/22 1153  TempSrc: Oral  PainSc: 0-No pain      Patients Stated Pain Goal: 6 (59/93/57 0177)  Complications: No notable events documented.

## 2022-02-28 NOTE — Anesthesia Postprocedure Evaluation (Signed)
Anesthesia Post Note  Patient: Earlean Polka  Procedure(s) Performed: EXCISION OF SUPERFICIAL LESION OF NECK (Right: Neck)     Patient location during evaluation: PACU Anesthesia Type: General Level of consciousness: awake and alert Pain management: pain level controlled Vital Signs Assessment: post-procedure vital signs reviewed and stable Respiratory status: spontaneous breathing, nonlabored ventilation, respiratory function stable and patient connected to nasal cannula oxygen Cardiovascular status: blood pressure returned to baseline and stable Postop Assessment: no apparent nausea or vomiting Anesthetic complications: no   No notable events documented.  Last Vitals:  Vitals:   02/28/22 1345 02/28/22 1404  BP: 120/69 125/77  Pulse: 75 88  Resp: 13 20  Temp:  36.4 C  SpO2: 100% 99%    Last Pain:  Vitals:   02/28/22 1404  TempSrc: Oral  PainSc: 0-No pain                 Kenyetta Wimbish S

## 2022-02-28 NOTE — Anesthesia Procedure Notes (Signed)
Procedure Name: LMA Insertion Date/Time: 02/28/2022 10:44 AM Performed by: Garrel Ridgel, CRNA Pre-anesthesia Checklist: Patient identified, Emergency Drugs available, Suction available and Patient being monitored Patient Re-evaluated:Patient Re-evaluated prior to induction Oxygen Delivery Method: Circle system utilized Preoxygenation: Pre-oxygenation with 100% oxygen Induction Type: IV induction Ventilation: Mask ventilation without difficulty LMA: LMA inserted LMA Size: 4.0 Number of attempts: 1 Placement Confirmation: positive ETCO2 Tube secured with: Tape Dental Injury: Teeth and Oropharynx as per pre-operative assessment

## 2022-02-28 NOTE — Anesthesia Preprocedure Evaluation (Signed)
Anesthesia Evaluation  Patient identified by MRN, date of birth, ID band Patient awake    Reviewed: Allergy & Precautions, H&P , NPO status , Patient's Chart, lab work & pertinent test results  Airway Mallampati: II   Neck ROM: full    Dental   Pulmonary neg pulmonary ROS,    breath sounds clear to auscultation       Cardiovascular negative cardio ROS   Rhythm:regular Rate:Normal     Neuro/Psych    GI/Hepatic   Endo/Other    Renal/GU      Musculoskeletal   Abdominal   Peds  Hematology   Anesthesia Other Findings   Reproductive/Obstetrics                             Anesthesia Physical Anesthesia Plan  ASA: 1  Anesthesia Plan: General   Post-op Pain Management:    Induction: Intravenous  PONV Risk Score and Plan: 2 and Ondansetron, Dexamethasone, Midazolam and Treatment may vary due to age or medical condition  Airway Management Planned: Oral ETT  Additional Equipment:   Intra-op Plan:   Post-operative Plan: Extubation in OR  Informed Consent: I have reviewed the patients History and Physical, chart, labs and discussed the procedure including the risks, benefits and alternatives for the proposed anesthesia with the patient or authorized representative who has indicated his/her understanding and acceptance.     Dental advisory given  Plan Discussed with: CRNA, Anesthesiologist and Surgeon  Anesthesia Plan Comments:         Anesthesia Quick Evaluation  

## 2022-02-28 NOTE — H&P (Signed)
Gerald Hamilton is an 26 y.o. male.    Chief Complaint:  Superficial cyst of right neck  HPI: Patient presents today for planned elective procedure.  He denies any interval change in history since office visit on 01/16/2022:  Gerald Hamilton is a 26 y.o. male who presents as a new consult patient, referred by Andrena Mews, MD for evaluation and treatment of superficial lesion on right neck. Seen by his PCP for his symptoms recently, and an ultrasound was ordered for further evaluation. He states that the lesion has been present for several years, but that it has gradually increased in size. He denies any pain, erythema or edema in association with the lesion.  Past Medical History:  Diagnosis Date   Dermoid cyst of neck     Past Surgical History:  Procedure Laterality Date   NO PAST SURGERIES      Family History  Problem Relation Age of Onset   Healthy Mother    Healthy Father     Social History:  reports that he has never smoked. He has never used smokeless tobacco. He reports current alcohol use. He reports that he does not use drugs.  Allergies: No Known Allergies  Medications Prior to Admission  Medication Sig Dispense Refill   emtricitabine-tenofovir AF (DESCOVY) 200-25 MG tablet Take 1 tablet by mouth daily. 30 tablet 2    No results found for this or any previous visit (from the past 48 hour(s)). No results found.  ROS: ROS  Blood pressure 112/75, pulse 76, temperature 98.1 F (36.7 C), temperature source Oral, resp. rate 18, height '5\' 5"'$  (1.651 m), weight 59.4 kg, SpO2 100 %.  PHYSICAL EXAM: Physical Exam Constitutional:      Appearance: Normal appearance.  HENT:     Head: Normocephalic.     Right Ear: External ear normal.     Left Ear: External ear normal.     Nose: Nose normal.  Neck:     Comments: 2cm superficial mass lesion of the right neck consistent with dermoid cyst Pulmonary:     Effort: Pulmonary effort is normal.  Neurological:     General: No focal  deficit present.     Mental Status: He is alert and oriented to person, place, and time.  Psychiatric:        Mood and Affect: Mood normal.        Behavior: Behavior normal.    Studies Reviewed: [place] reviewed   Assessment/Plan Magic Mohler is a 26 y.o. male with superficial mass lesion of the right neck consistent with dermoid cyst. Patient has had ultrasound for further evaluation, which also favors benign superficial process. No history of infection or spontaneous drainage.  -To OR today for excisional biopsy under local anesthesia. Risks of surgery, including recurrence, injury to surrounding structures resulting in temporary or permanent numbness of the earlobe, bleeding, infection were reviewed with patient who expressed understanding and agreement. All questions answered.   Anara Cowman A Jisel Fleet 02/28/2022, 11:56 AM

## 2022-03-01 ENCOUNTER — Encounter (HOSPITAL_BASED_OUTPATIENT_CLINIC_OR_DEPARTMENT_OTHER): Payer: Self-pay | Admitting: Otolaryngology

## 2022-03-01 LAB — SURGICAL PATHOLOGY

## 2022-03-01 NOTE — Progress Notes (Signed)
Left message stating courtesy call and if any questions or concerns please call the doctors office.  

## 2022-03-09 ENCOUNTER — Other Ambulatory Visit (HOSPITAL_COMMUNITY): Payer: Self-pay

## 2022-03-13 ENCOUNTER — Encounter: Payer: Self-pay | Admitting: *Deleted

## 2022-03-14 ENCOUNTER — Other Ambulatory Visit (HOSPITAL_COMMUNITY): Payer: Self-pay

## 2022-03-25 ENCOUNTER — Emergency Department (HOSPITAL_COMMUNITY): Payer: Medicaid Other

## 2022-03-25 ENCOUNTER — Emergency Department (HOSPITAL_COMMUNITY)
Admission: EM | Admit: 2022-03-25 | Discharge: 2022-03-25 | Disposition: A | Discharge status: Home / Self Care | Payer: Medicaid Other | Attending: Emergency Medicine | Admitting: Emergency Medicine

## 2022-03-25 ENCOUNTER — Encounter (HOSPITAL_COMMUNITY): Payer: Self-pay

## 2022-03-25 DIAGNOSIS — Y907 Blood alcohol level of 200-239 mg/100 ml: Secondary | ICD-10-CM | POA: Insufficient documentation

## 2022-03-25 DIAGNOSIS — R4182 Altered mental status, unspecified: Secondary | ICD-10-CM | POA: Insufficient documentation

## 2022-03-25 DIAGNOSIS — F10120 Alcohol abuse with intoxication, uncomplicated: Secondary | ICD-10-CM | POA: Diagnosis not present

## 2022-03-25 DIAGNOSIS — F10920 Alcohol use, unspecified with intoxication, uncomplicated: Secondary | ICD-10-CM

## 2022-03-25 LAB — COMPREHENSIVE METABOLIC PANEL
ALT: 22 U/L (ref 0–44)
AST: 27 U/L (ref 15–41)
Albumin: 4.8 g/dL (ref 3.5–5.0)
Alkaline Phosphatase: 46 U/L (ref 38–126)
Anion gap: 12 (ref 5–15)
BUN: 13 mg/dL (ref 6–20)
CO2: 23 mmol/L (ref 22–32)
Calcium: 9.1 mg/dL (ref 8.9–10.3)
Chloride: 108 mmol/L (ref 98–111)
Creatinine, Ser: 0.82 mg/dL (ref 0.61–1.24)
GFR, Estimated: >60 mL/min (ref >60)
Glucose, Bld: 96 mg/dL (ref 70–99)
Potassium: 3.4 mmol/L — ABNORMAL LOW (ref 3.5–5.1)
Sodium: 143 mmol/L (ref 135–145)
Total Bilirubin: 0.8 mg/dL (ref 0.3–1.2)
Total Protein: 7.6 g/dL (ref 6.5–8.1)

## 2022-03-25 LAB — ETHANOL: Alcohol, Ethyl (B): 215 mg/dL — ABNORMAL HIGH (ref <10)

## 2022-03-25 LAB — CBC WITH DIFFERENTIAL/PLATELET
Abs Immature Granulocytes: 0.01 10*3/uL (ref 0.00–0.07)
Basophils Absolute: 0 10*3/uL (ref 0.0–0.1)
Basophils Relative: 1 %
Eosinophils Absolute: 0 10*3/uL (ref 0.0–0.5)
Eosinophils Relative: 0 %
HCT: 41.1 % (ref 39.0–52.0)
Hemoglobin: 13.9 g/dL (ref 13.0–17.0)
Immature Granulocytes: 0 %
Lymphocytes Relative: 18 %
Lymphs Abs: 0.8 10*3/uL (ref 0.7–4.0)
MCH: 31.7 pg (ref 26.0–34.0)
MCHC: 33.8 g/dL (ref 30.0–36.0)
MCV: 93.8 fL (ref 80.0–100.0)
Monocytes Absolute: 0.2 10*3/uL (ref 0.1–1.0)
Monocytes Relative: 5 %
Neutro Abs: 3.4 10*3/uL (ref 1.7–7.7)
Neutrophils Relative %: 76 %
Platelets: 174 10*3/uL (ref 150–400)
RBC: 4.38 MIL/uL (ref 4.22–5.81)
RDW: 12 % (ref 11.5–15.5)
WBC: 4.5 10*3/uL (ref 4.0–10.5)
nRBC: 0 % (ref 0.0–0.2)

## 2022-03-25 MED ORDER — SODIUM CHLORIDE 0.9 % IV BOLUS
1000.0000 mL | Freq: Once | INTRAVENOUS | Status: AC
  Administered 2022-03-25: 1000 mL via INTRAVENOUS

## 2022-03-25 MED ORDER — ONDANSETRON 4 MG PO TBDP: 4.0000 mg | ORAL_TABLET | Freq: Three times a day (TID) | ORAL | 0 refills | Status: AC | PRN

## 2022-03-25 NOTE — ED Notes (Signed)
Patient provided with water and crackers.  Will open eyes to voice and answer basic questions.

## 2022-03-25 NOTE — ED Provider Notes (Signed)
Emergency Department Provider Note   I have reviewed the triage vital signs and the nursing notes.   HISTORY  Chief Complaint Alcohol Intoxication   HPI Gerald Hamilton is a 26 y.o. male presents to the emergency department by EMS after [place].  EMS presumed alcohol intoxication.  Patient is drowsy and is able to deny drug use or assault.  He is unable to tell me if he is fallen.  He is unable to quantify how much he drank. He continues to repeat "I'm sorry" when questioned.    Past Medical History:  Diagnosis Date   Dermoid cyst of neck     Review of Systems   Level 5 caveat: AMS - apparent intoxication  ____________________________________________   PHYSICAL EXAM:  VITAL SIGNS: ED Triage Vitals  Enc Vitals Group     BP 03/25/22 0048 (!) 101/56     Pulse Rate 03/25/22 0048 64     Resp 03/25/22 0048 14     Temp 03/25/22 0048 (!) 97.4 F (36.3 C)     Temp Source 03/25/22 0048 Oral     SpO2 03/25/22 0045 96 %     Weight 03/25/22 0048 130 lb (59 kg)     Height 03/25/22 0048 '5\' 5"'$  (1.651 m)   Constitutional: Arouses to voice but repetitive.  Eyes: Conjunctivae are normal. PERRL (3 mm).  Head: Atraumatic. Nose: No congestion/rhinnorhea. Mouth/Throat: Mucous membranes are moist.   Neck: No stridor. No cervical spine tenderness to palpation. Cardiovascular: Normal rate, regular rhythm. Good peripheral circulation. Grossly normal heart sounds.   Respiratory: Normal respiratory effort.  No retractions. Lungs CTAB. Gastrointestinal: Soft and nontender. No distention.  Musculoskeletal: No lower extremity tenderness nor edema. No gross deformities of extremities. Neurologic:  Speech is slurred. No gross focal neurologic deficits are appreciated.  Skin:  Skin is warm, dry and intact. No rash noted.  ____________________________________________   LABS (all labs ordered are listed, but only abnormal results are displayed)  Labs Reviewed   COMPREHENSIVE METABOLIC PANEL - Abnormal; Notable for the following components:      Result Value   Potassium 3.4 (*)    All other components within normal limits  ETHANOL - Abnormal; Notable for the following components:   Alcohol, Ethyl (B) 215 (*)    All other components within normal limits  CBC WITH DIFFERENTIAL/PLATELET   ____________________________________________  RADIOLOGY  CT Head Wo Contrast  Result Date: 03/25/2022 CLINICAL DATA:  Mental status change of unknown cause. EXAM: CT HEAD WITHOUT CONTRAST TECHNIQUE: Contiguous axial images were obtained from the base of the skull through the vertex without intravenous contrast. RADIATION DOSE REDUCTION: This exam was performed according to the departmental dose-optimization program which includes automated exposure control, adjustment of the mA and/or kV according to patient size and/or use of iterative reconstruction technique. COMPARISON:  None Available. FINDINGS: Brain: No evidence of acute infarction, hemorrhage, hydrocephalus, extra-axial collection or mass lesion/mass effect. Vascular: No hyperdense vessel or unexpected calcification. Skull: Normal. Negative for fracture or focal lesion. Sinuses/Orbits: No acute finding. Other: None. IMPRESSION: No acute intracranial abnormalities. Electronically Signed   By: Lucienne Capers M.D.   On: 03/25/2022 02:10    ____________________________________________   PROCEDURES  Procedure(s) performed:   Procedures  CRITICAL CARE Performed by: Margette Fast Total critical care time: 35 minutes Critical care time was exclusive of separately billable procedures and treating other patients. Critical care was necessary to treat or prevent imminent or life-threatening  deterioration. Critical care was time spent personally by me on the following activities: development of treatment plan with patient and/or surrogate as well as nursing, discussions with consultants, evaluation of patient's  response to treatment, examination of patient, obtaining history from patient or surrogate, ordering and performing treatments and interventions, ordering and review of laboratory studies, ordering and review of radiographic studies, pulse oximetry and re-evaluation of patient's condition.  Nanda Quinton, MD Emergency Medicine  ____________________________________________   INITIAL IMPRESSION / ASSESSMENT AND PLAN / ED COURSE  Pertinent labs & imaging results that were available during my care of the patient were reviewed by me and considered in my medical decision making (see chart for details).   This patient is Presenting for Evaluation of AMS, which does require a range of treatment options, and is a complaint that involves a high risk of morbidity and mortality.  The Differential Diagnoses includes but is not exclusive to alcohol, illicit or prescription medications, intracranial pathology such as stroke, intracerebral hemorrhage, fever or infectious causes including sepsis, hypoxemia, uremia, trauma, endocrine related disorders such as diabetes, hypoglycemia, thyroid-related diseases, etc.   Critical Interventions-    Medications  sodium chloride 0.9 % bolus 1,000 mL (0 mLs Intravenous Stopped 03/25/22 0505)    Reassessment after intervention: Patient sobering and clinically improving.    I did obtain Additional Historical Information from EMS.    Clinical Laboratory Tests Ordered, included including alcohol of 215.  No acute kidney injury.  No anemia or leukocytosis.  Radiologic Tests Ordered, included CT head. I independently interpreted the images and agree with radiology interpretation.   Cardiac Monitor Tracing which shows NSR.   Medical Decision Making: Summary:  Patient arrives to the emergency department after being found down on the sidewalk.  He appears intoxicated but is unable to quantify how much she had to drink or give a clear history of events as to how he  ended up on the sidewalk.  I do not see any outward sign of trauma but given limited history I do plan for lab work and CT imaging of the head.   Reevaluation with update and discussion with patient.  He is clinically sobering and able to recount most of the events from last night.  No acute findings on labs or imaging.  He will call for a ride home.  Considered admission but patient is clinically improving and looking much better after treatment.  Plan for discharge.  Disposition: discharge  ____________________________________________  FINAL CLINICAL IMPRESSION(S) / ED DIAGNOSES  Final diagnoses:  Alcoholic intoxication without complication ([place])     NEW OUTPATIENT MEDICATIONS STARTED DURING THIS VISIT:  Discharge Medication List as of 03/25/2022  6:31 AM     START taking these medications   Details  ondansetron (ZOFRAN-ODT) 4 MG disintegrating tablet Take 1 tablet (4 mg total) by mouth every 8 (eight) hours as needed for nausea or vomiting., Starting Sun 03/25/2022, Normal        Note:  This document was prepared using Dragon voice recognition software and may include unintentional dictation errors.  Nanda Quinton, MD, [facility] Emergency Medicine    Naiara Lombardozzi, [facility], MD 03/25/22 [phone]

## 2022-03-25 NOTE — ED Notes (Signed)
Pt given bus pass ?

## 2022-03-25 NOTE — ED Notes (Signed)
Transported to CT 

## 2022-03-25 NOTE — Discharge Instructions (Signed)
Please drink plenty of fluids as you recover.

## 2022-03-25 NOTE — ED Triage Notes (Signed)
[place], [employer] found pt laying on sidewalk, called EMS for public intoxication. [employer] 14.

## 2022-03-29 ENCOUNTER — Ambulatory Visit: Payer: Medicaid Other | Admitting: Pharmacist

## 2022-03-29 ENCOUNTER — Other Ambulatory Visit (HOSPITAL_COMMUNITY): Payer: Self-pay

## 2022-06-05 ENCOUNTER — Other Ambulatory Visit (HOSPITAL_COMMUNITY): Payer: Self-pay

## 2022-08-28 IMAGING — CT CT HEAD W/O CM
3 series · 16 of 47 positions shown, 19 images · non-contrast
Comparison: None Available.

CLINICAL DATA: Mental status change of unknown cause.



[Series 2: head wo · axial · 0.43mm/px · z∈[+178,+308]mm · 10 of 32 slices shown, 13 images]
[im 3/32  brain]
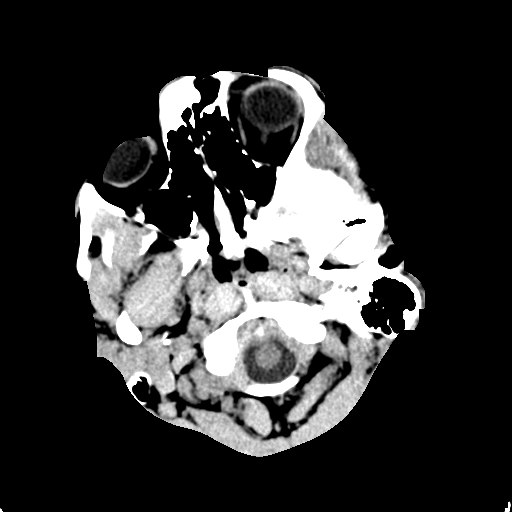
[im 3/32  bone]
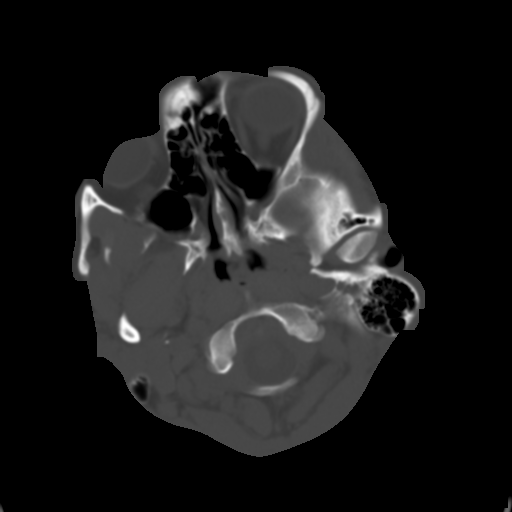
[im 6/32  brain]
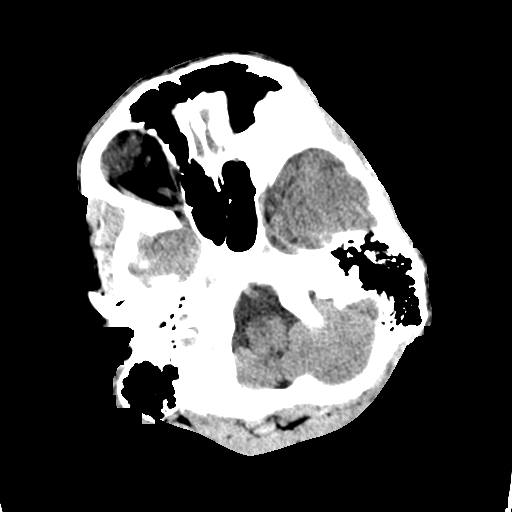
[im 9/32  brain]
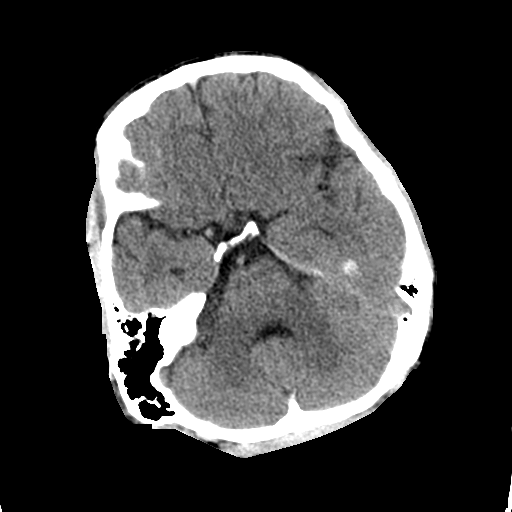
[im 11/32  brain]
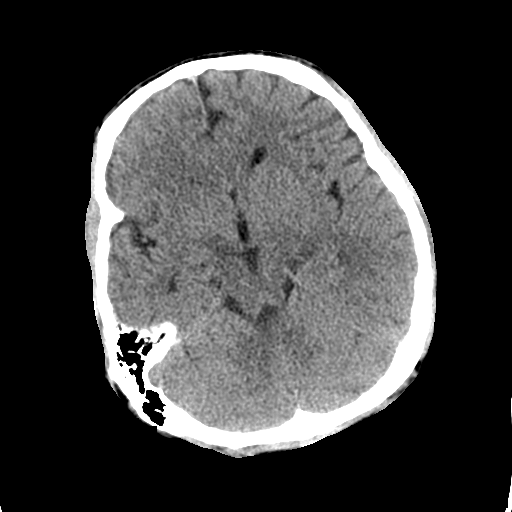
[im 14/32  brain]
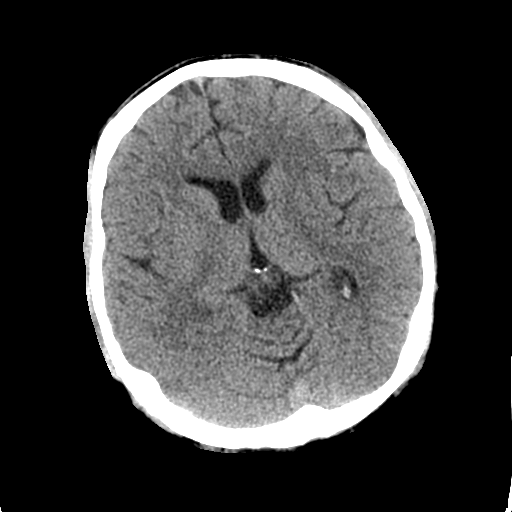
[im 14/32  bone]
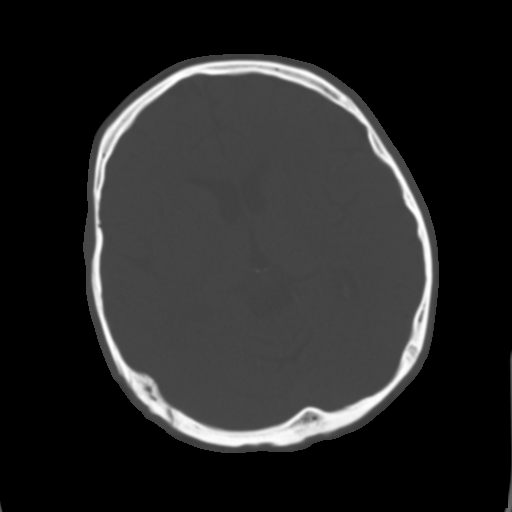
[im 18/32  brain]
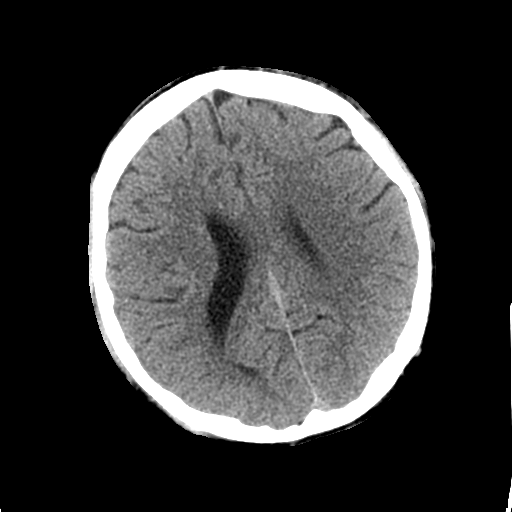
[im 21/32  brain]
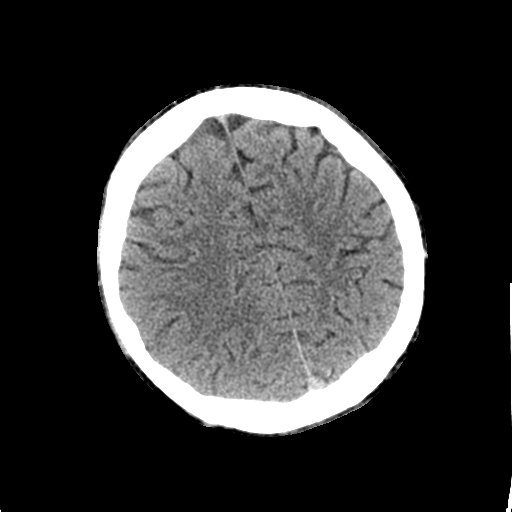
[im 24/32  brain]
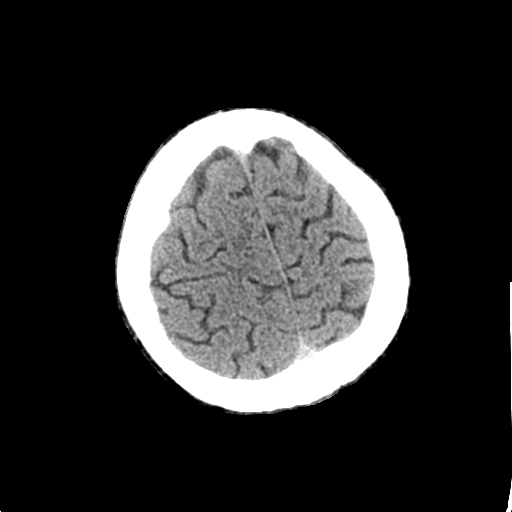
[im 26/32  brain]
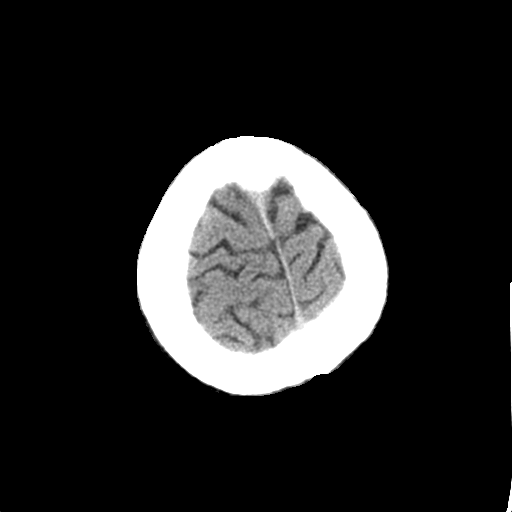
[im 26/32  bone]
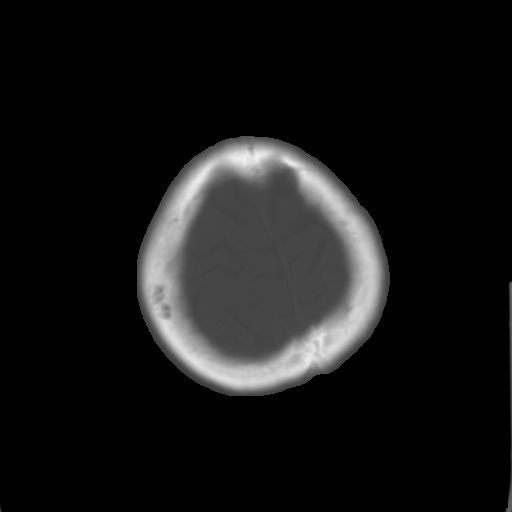
[im 29/32  brain]
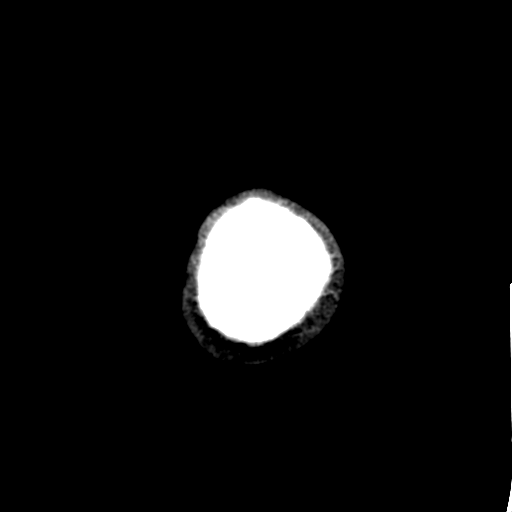

[Series 5: coronal soft tissue · coronal · 0.34mm/px · 3 of 66 slices shown]
[im 22/66  brain]
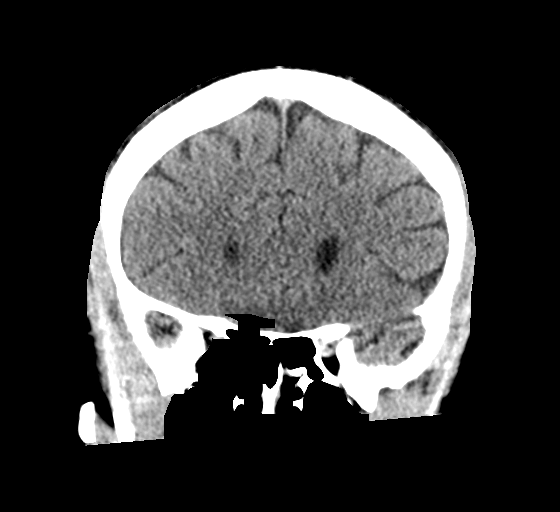
[im 29/66  brain]
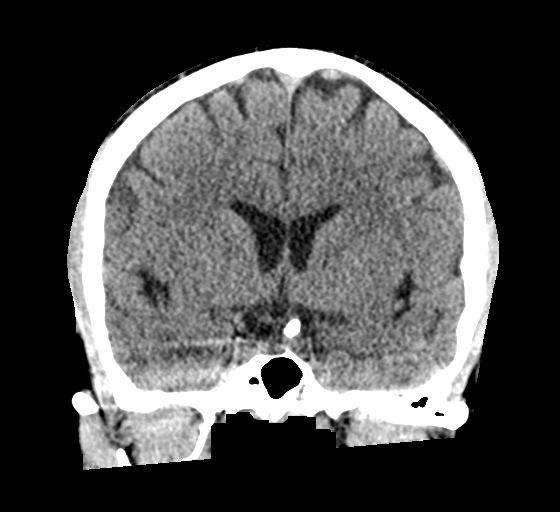
[im 37/66  brain]
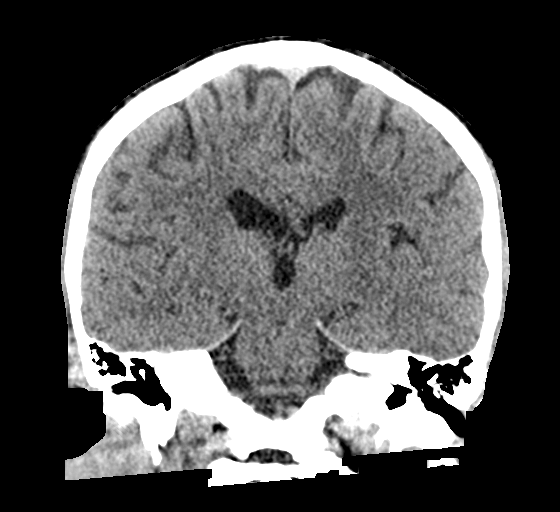

[Series 6: sagittal soft tissue · sagittal · 0.35mm/px · 3 of 58 slices shown]
[im 20/58  brain]
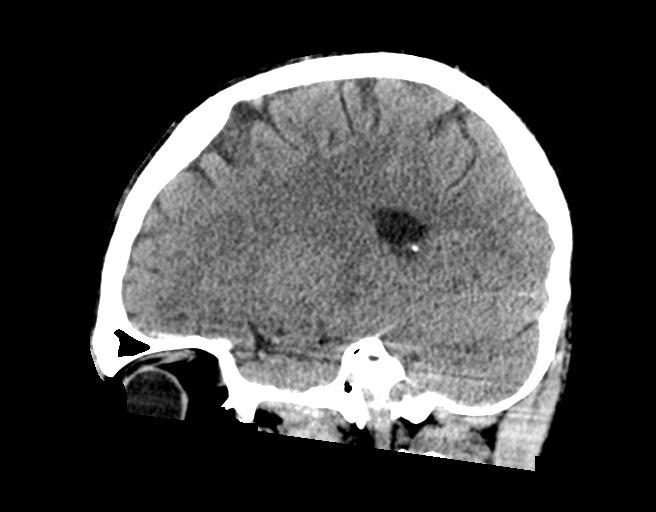
[im 29/58  brain]
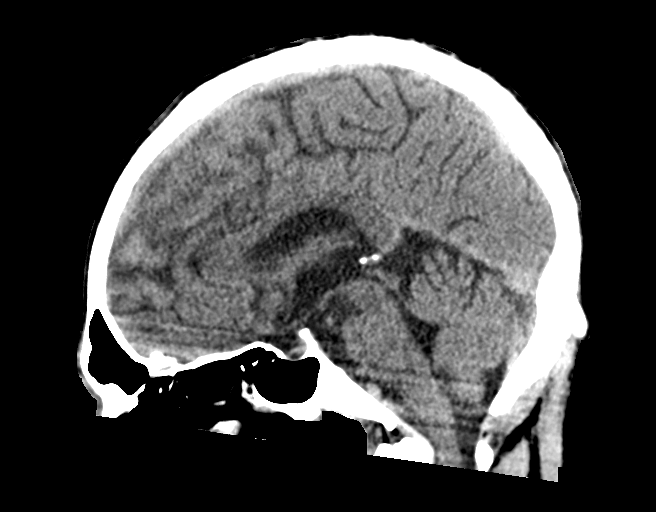
[im 39/58  brain]
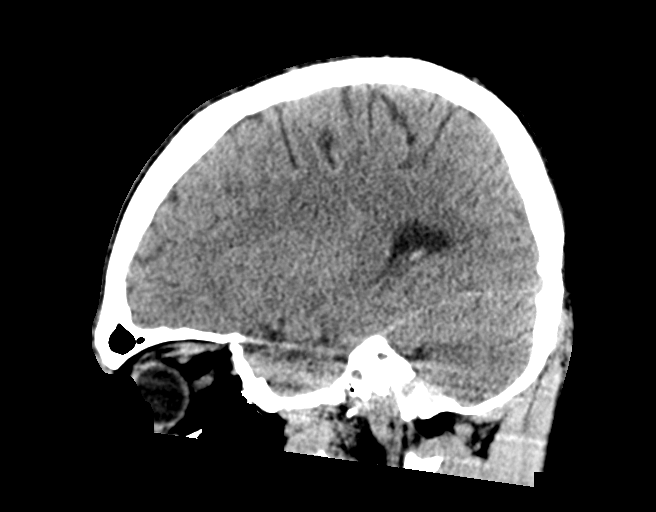

[16 of 47 positions shown; findings below may reference images not displayed]

FINDINGS: Brain: No evidence of acute infarction, hemorrhage, hydrocephalus,
extra-axial collection or mass lesion/mass effect.

Vascular: No hyperdense vessel or unexpected calcification.

Skull: Normal. Negative for fracture or focal lesion.

Sinuses/Orbits: No acute finding.

Other: None.
IMPRESSION: No acute intracranial abnormalities.

## 2022-10-19 ENCOUNTER — Other Ambulatory Visit (HOSPITAL_COMMUNITY): Payer: Self-pay

## 2023-03-13 ENCOUNTER — Other Ambulatory Visit (HOSPITAL_COMMUNITY): Payer: Self-pay

## 2023-03-13 ENCOUNTER — Ambulatory Visit (INDEPENDENT_AMBULATORY_CARE_PROVIDER_SITE_OTHER): Payer: Self-pay | Admitting: Pharmacist

## 2023-03-13 ENCOUNTER — Telehealth: Payer: Self-pay

## 2023-03-13 ENCOUNTER — Other Ambulatory Visit: Payer: Self-pay

## 2023-03-13 DIAGNOSIS — Z113 Encounter for screening for infections with a predominantly sexual mode of transmission: Secondary | ICD-10-CM

## 2023-03-13 DIAGNOSIS — Z79899 Other long term (current) drug therapy: Secondary | ICD-10-CM

## 2023-03-13 DIAGNOSIS — Z23 Encounter for immunization: Secondary | ICD-10-CM

## 2023-03-13 MED ORDER — DESCOVY 200-25 MG PO TABS: 1.0000 | ORAL_TABLET | Freq: Every day | ORAL | 0 refills | Status: DC

## 2023-03-13 MED ORDER — DESCOVY 200-25 MG PO TABS
1.0000 | ORAL_TABLET | Freq: Every day | ORAL | 0 refills | Status: DC
Start: 2023-03-13 — End: 2023-03-15
  Filled 2023-03-14: qty 30, 30d supply, fill #0

## 2023-03-13 NOTE — Telephone Encounter (Signed)
[facility] Patient Advocate Encounter  Completed and sent Advancing Access application for Descovy for this patient who is uninsured.    Patient is approved 03/13/23 through 03/11/24.          Clearance Coots, CPhT Specialty Pharmacy Patient [facility] Phone: [phone] Fax:  [phone]

## 2023-03-13 NOTE — Progress Notes (Signed)
gard  Date:  03/13/2023   HPI: Gerald Hamilton is a 27 y.o. male who presents to the [facility] pharmacy clinic for HIV PrEP follow-up.  Insured   []    Uninsured  [x]    Patient Active Problem List   Diagnosis Date Noted   Molluscum contagiosum 04/21/2021   Neck mass 10/14/2020   Encounter for assessment of STD exposure 10/14/2020    Patient's Medications  New Prescriptions   No medications on file  Previous Medications   EMTRICITABINE-TENOFOVIR AF (DESCOVY) 200-25 MG TABLET    Take 1 tablet by mouth daily.   ONDANSETRON (ZOFRAN-ODT) 4 MG DISINTEGRATING TABLET    Take 1 tablet (4 mg total) by mouth every 8 (eight) hours as needed for nausea or vomiting.  Modified Medications   No medications on file  Discontinued Medications   No medications on file    Allergies: No Known Allergies  Past Medical History: Past Medical History:  Diagnosis Date   Dermoid cyst of neck     Social History: Social History   Socioeconomic History   Marital status: Single    Spouse name: Not on file   Number of children: Not on file   Years of education: Not on file   Highest education level: Not on file  Occupational History   Not on file  Tobacco Use   Smoking status: Never   Smokeless tobacco: Never  Vaping Use   Vaping Use: Never used  Substance and Sexual Activity   Alcohol use: Yes    Alcohol/week: 3.0 standard drinks of alcohol    Types: 3 Shots of liquor per week   Drug use: No   Sexual activity: Not Currently  Other Topics Concern   Not on file  Social History Narrative   Not on file   Social Determinants of Health   Financial Resource Strain: Not on file  Food Insecurity: Not on file  Transportation Needs: Not on file  Physical Activity: Not on file  Stress: Not on file  Social Connections: Not on file       12/27/2021    2:36 PM  [facility] HIV PREP FLOWSHEET RESULTS  Insurance Status Insured  How did you hear? [facility]  Gender at birth Male  Gender identity cis-Male  Sex  Partners Men only  Sex activity preferences Receptive;Oral  Condom use Yes  % condom use 80  Treated for STI? No  HIV symptoms? None  PrEP Eligibility Yes  Paper work received? No    Labs:  SCr: Lab Results  Component Value Date   CREATININE 0.82 03/25/2022   CREATININE 0.82 12/04/2021   HIV Lab Results  Component Value Date   HIV NON-REACTIVE 12/27/2021   HIV Non Reactive 12/04/2021   HIV Non Reactive 10/14/2020   Hepatitis B Lab Results  Component Value Date   HEPBSAG Negative 12/04/2021   HEPBCAB Negative 12/04/2021   Hepatitis C No results found for: "HEPCAB", "HCVRNAPCRQN" Hepatitis A Lab Results  Component Value Date   HAV Positive (A) 12/04/2021   RPR and STI Lab Results  Component Value Date   LABRPR Non Reactive 12/04/2021   LABRPR Non Reactive 10/14/2020    STI Results GC CT  12/27/2021  2:31 PM Negative    Negative  Negative    Negative   12/04/2021 11:01 AM Negative  Negative   10/14/2020 10:52 AM Negative  Negative     Assessment: Gerald Hamilton presents today to re-establish care for HIV PrEP. I previously saw him in March 2023  and initiated PrEP. He has not followed up since then. He states that he did not have a stable car and was busy with work. He was also not dating anyone at the time so he stopped taking PrEP. He is currently uninsured and will need [employer] patient assistance for his Descovy.   He wishes to restart PrEP today. He was initially interested in Apretude but once I explained the importance of coming to his appointments and not missing his injection window, he changed his mind and decided to restart Descovy. He has one serious partner now but has had several partners in the past year. He states that he has not had intercourse with these partners. Screened for acute HIV symptoms such as fatigue, muscle aches, rash, sore throat, lymphadenopathy, headache, night sweats, nausea/vomiting/diarrhea, and fever. Denies any symptoms. No exposures or  concerns for any STIs today. Agrees to full STI testing today with RPR and oral/urine/rectal cytologies.    Will update lab work and refill his Descovy pending a negative HIV test. Will see him back in 3 months. He is due for his 3rd and final HPV vaccine so will administer that today. He finished his Hepatitis B vaccine series back in March 2023 so will check for immunity today.   Plan: - HIV antibody, RPR, hepatitis B surface antibody, and urine/rectal/pharyngeal cytologies for GC/chlamydia today - Third and final HPV vaccine - Descovy x 3 months if HIV negative - F/u with me on 06/11/23  [name] L. Rajiv Parlato, PharmD, BCIDP, AAHIVP, CPP Clinical Pharmacist Practitioner Infectious Diseases Clinical Pharmacist [facility] 03/13/2023, 9:32 AM

## 2023-03-14 ENCOUNTER — Other Ambulatory Visit (HOSPITAL_COMMUNITY): Payer: Self-pay

## 2023-03-14 LAB — CYTOLOGY, (ORAL, ANAL, URETHRAL) ANCILLARY ONLY
Chlamydia: NEGATIVE
Chlamydia: NEGATIVE
Comment: NEGATIVE
Comment: NEGATIVE
Comment: NORMAL
Comment: NORMAL
Neisseria Gonorrhea: NEGATIVE
Neisseria Gonorrhea: NEGATIVE

## 2023-03-14 LAB — HEPATITIS B SURFACE ANTIBODY,QUALITATIVE: Hep B S Ab: REACTIVE — AB

## 2023-03-14 LAB — URINE CYTOLOGY ANCILLARY ONLY
Chlamydia: NEGATIVE
Comment: NEGATIVE
Comment: NORMAL
Neisseria Gonorrhea: NEGATIVE

## 2023-03-14 LAB — RPR: RPR Ser Ql: NONREACTIVE

## 2023-03-14 LAB — HIV ANTIBODY (ROUTINE TESTING W REFLEX): HIV 1&2 Ab, 4th Generation: NONREACTIVE

## 2023-03-15 ENCOUNTER — Encounter: Payer: Self-pay | Admitting: Pharmacist

## 2023-03-15 ENCOUNTER — Other Ambulatory Visit: Payer: Self-pay

## 2023-03-15 ENCOUNTER — Other Ambulatory Visit: Payer: Self-pay | Admitting: Pharmacist

## 2023-03-15 ENCOUNTER — Other Ambulatory Visit (HOSPITAL_COMMUNITY): Payer: Self-pay

## 2023-03-15 DIAGNOSIS — Z79899 Other long term (current) drug therapy: Secondary | ICD-10-CM

## 2023-03-15 MED ORDER — DESCOVY 200-25 MG PO TABS
1.0000 | ORAL_TABLET | Freq: Every day | ORAL | 2 refills | Status: AC
  Filled 2023-03-15 (×2): qty 30, 30d supply, fill #0
  Filled 2023-04-08: qty 30, 30d supply, fill #1
  Filled 2023-05-02: qty 30, 30d supply, fill #2

## 2023-04-04 ENCOUNTER — Other Ambulatory Visit (HOSPITAL_COMMUNITY): Payer: Self-pay

## 2023-04-08 ENCOUNTER — Other Ambulatory Visit (HOSPITAL_COMMUNITY): Payer: Self-pay

## 2023-05-02 ENCOUNTER — Other Ambulatory Visit (HOSPITAL_COMMUNITY): Payer: Self-pay

## 2023-05-08 ENCOUNTER — Other Ambulatory Visit: Payer: Self-pay

## 2023-06-04 ENCOUNTER — Other Ambulatory Visit: Payer: Self-pay

## 2023-06-11 ENCOUNTER — Other Ambulatory Visit (HOSPITAL_COMMUNITY): Payer: Self-pay

## 2023-06-11 ENCOUNTER — Ambulatory Visit: Payer: Self-pay | Admitting: Pharmacist

## 2023-06-20 ENCOUNTER — Other Ambulatory Visit (HOSPITAL_COMMUNITY): Payer: Self-pay

## 2023-06-24 ENCOUNTER — Other Ambulatory Visit (HOSPITAL_COMMUNITY): Payer: Self-pay

## 2023-07-02 ENCOUNTER — Other Ambulatory Visit: Payer: Self-pay

## 2023-07-05 ENCOUNTER — Other Ambulatory Visit: Payer: Self-pay

## 2023-07-25 ENCOUNTER — Other Ambulatory Visit: Payer: Self-pay

## 2023-08-20 ENCOUNTER — Other Ambulatory Visit: Payer: Self-pay | Admitting: Pharmacist

## 2023-08-20 NOTE — Progress Notes (Signed)
Patient has not filled PrEP medication since August 2024 and no showed PrEP follow up in September.  Margarite Gouge, PharmD, CPP, BCIDP, AAHIVP Clinical Pharmacist Practitioner Infectious Diseases Clinical Pharmacist [facility]

## 2024-03-17 ENCOUNTER — Encounter: Payer: Self-pay | Admitting: *Deleted
